# Patient Record
Sex: Female | Born: 1990
Health system: Southern US, Community
[De-identification: ages and names within clinical notes are randomized; demographics above are authoritative.]

## PROBLEM LIST (undated history)

## (undated) ENCOUNTER — Inpatient Hospital Stay (HOSPITAL_COMMUNITY): Payer: Self-pay

## (undated) DIAGNOSIS — F909 Attention-deficit hyperactivity disorder, unspecified type: Secondary | ICD-10-CM

## (undated) DIAGNOSIS — O139 Gestational [pregnancy-induced] hypertension without significant proteinuria, unspecified trimester: Secondary | ICD-10-CM

## (undated) DIAGNOSIS — E119 Type 2 diabetes mellitus without complications: Secondary | ICD-10-CM

## (undated) DIAGNOSIS — O24419 Gestational diabetes mellitus in pregnancy, unspecified control: Secondary | ICD-10-CM

## (undated) DIAGNOSIS — E039 Hypothyroidism, unspecified: Secondary | ICD-10-CM

## (undated) DIAGNOSIS — Z87442 Personal history of urinary calculi: Secondary | ICD-10-CM

## (undated) HISTORY — DX: Gestational diabetes mellitus in pregnancy, unspecified control: O24.419

## (undated) HISTORY — DX: Attention-deficit hyperactivity disorder, unspecified type: F90.9

## (undated) HISTORY — DX: Hypothyroidism, unspecified: E03.9

## (undated) HISTORY — DX: Gestational (pregnancy-induced) hypertension without significant proteinuria, unspecified trimester: O13.9

## (undated) HISTORY — DX: Type 2 diabetes mellitus without complications: E11.9

## (undated) HISTORY — DX: Personal history of urinary calculi: Z87.442

## (undated) HISTORY — PX: LITHOTRIPSY: SUR834

## (undated) HISTORY — PX: TYMPANOSTOMY TUBE PLACEMENT: SHX32

---
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
## 2012-11-02 NOTE — L&D Delivery Note (Signed)
Operative Delivery Note At 12:52 AM a viable female was delivered via Vaginal, Vacuum Investment banker, operational).  Presentation: vertex; Position: Left,, Occiput,, Anterior; Station: +2.  Verbal consent: obtained from patient.  Risks and benefits discussed in detail.  Risks include, but are not limited to the risks of anesthesia, bleeding, infection, damage to maternal tissues, fetal cephalhematoma.  There is also the risk of inability to effect vaginal delivery of the head, or shoulder dystocia that cannot be resolved by established maneuvers, leading to the need for emergency cesarean section.  Vacuum assisted delivery offered due to 3 hours of pushing.  APGAR: 5, 7; weight pending.   Placenta status: Intact, Spontaneous.   Cord: 3 vessels with the following complications: None.   One minute shoulder dystocia resolved with McRoberts maneuver and suprapubic pressure, left shoulder rotated anterior.  Loose nuchal cord x 1 reduced.  Anesthesia: Epidural  Instruments: Mushroom cup Episiotomy: None Lacerations: 2nd degree;Vaginal;Sulcus;Perineal Suture Repair: 3.0 vicryl rapide Est. Blood Loss (mL): 500  Mom to postpartum.  Baby to Couplet care / Skin to Skin, stable.  Wandalee Klang D 10/29/2013, 1:23 AM

## 2013-04-17 LAB — OB RESULTS CONSOLE GC/CHLAMYDIA
Chlamydia: NEGATIVE
Gonorrhea: NEGATIVE

## 2013-04-17 LAB — OB RESULTS CONSOLE ANTIBODY SCREEN: Antibody Screen: NEGATIVE

## 2013-04-17 LAB — OB RESULTS CONSOLE ABO/RH: RH Type: POSITIVE

## 2013-04-17 LAB — OB RESULTS CONSOLE RPR: RPR: NONREACTIVE

## 2013-06-07 ENCOUNTER — Encounter: Payer: Medicaid Other | Attending: Obstetrics and Gynecology | Admitting: *Deleted

## 2013-06-07 VITALS — Ht 60.0 in | Wt 209.4 lb

## 2013-06-07 DIAGNOSIS — O99810 Abnormal glucose complicating pregnancy: Secondary | ICD-10-CM | POA: Insufficient documentation

## 2013-06-07 DIAGNOSIS — Z713 Dietary counseling and surveillance: Secondary | ICD-10-CM | POA: Insufficient documentation

## 2013-06-08 ENCOUNTER — Encounter: Payer: Self-pay | Admitting: *Deleted

## 2013-06-08 NOTE — Patient Instructions (Signed)
Goals:  Check glucose levels per MD as instructed  Follow Gestational Diabetes Diet as instructed  Call for follow-up as needed    

## 2013-06-08 NOTE — Progress Notes (Signed)
  Patient was seen on 06/07/13 for Gestational Diabetes self-management class at the [facility]. The following learning objectives were met by the patient during this course:   States the definition of Gestational Diabetes  States why dietary management is important in controlling blood glucose  Describes the effects each nutrient has on blood glucose levels  Demonstrates ability to create a balanced meal plan  Demonstrates carbohydrate counting   States when to check blood glucose levels  Demonstrates proper blood glucose monitoring techniques  States the effect of stress and exercise on blood glucose levels  States the importance of limiting caffeine and abstaining from alcohol and smoking  Blood glucose monitor given: Accu Chek Nano BG Monitoring Kit Lot # [id number] Exp: 08/01/14 Blood glucose reading: 97 mg/dl  Patient instructed to monitor glucose levels: FBS: 60 - <90 1 hour: <140 2 hour: <120  *Patient received handouts:  Nutrition Diabetes and Pregnancy  Carbohydrate Counting List  Patient will be seen for follow-up as needed.

## 2013-08-18 ENCOUNTER — Other Ambulatory Visit (HOSPITAL_COMMUNITY): Payer: Self-pay | Admitting: Obstetrics and Gynecology

## 2013-08-18 DIAGNOSIS — IMO0002 Reserved for concepts with insufficient information to code with codable children: Secondary | ICD-10-CM

## 2013-08-18 DIAGNOSIS — Z0489 Encounter for examination and observation for other specified reasons: Secondary | ICD-10-CM

## 2013-08-24 ENCOUNTER — Other Ambulatory Visit (HOSPITAL_COMMUNITY): Payer: Self-pay | Admitting: Obstetrics and Gynecology

## 2013-08-24 ENCOUNTER — Ambulatory Visit (HOSPITAL_COMMUNITY)
Admission: RE | Admit: 2013-08-24 | Discharge: 2013-08-24 | Disposition: A | Discharge status: Home / Self Care | Payer: Medicaid Other | Source: Ambulatory Visit | Attending: Obstetrics and Gynecology | Admitting: Obstetrics and Gynecology

## 2013-08-24 DIAGNOSIS — Z363 Encounter for antenatal screening for malformations: Secondary | ICD-10-CM

## 2013-08-24 DIAGNOSIS — Z0489 Encounter for examination and observation for other specified reasons: Secondary | ICD-10-CM

## 2013-08-24 DIAGNOSIS — IMO0002 Reserved for concepts with insufficient information to code with codable children: Secondary | ICD-10-CM

## 2013-08-24 DIAGNOSIS — E669 Obesity, unspecified: Secondary | ICD-10-CM | POA: Insufficient documentation

## 2013-08-24 DIAGNOSIS — Z3689 Encounter for other specified antenatal screening: Secondary | ICD-10-CM | POA: Insufficient documentation

## 2013-09-22 ENCOUNTER — Ambulatory Visit (HOSPITAL_COMMUNITY): Payer: Self-pay

## 2013-10-12 ENCOUNTER — Inpatient Hospital Stay (HOSPITAL_COMMUNITY)
Admission: AD | Admit: 2013-10-12 | Discharge: 2013-10-12 | Disposition: A | Discharge status: Home / Self Care | Payer: BC Managed Care – PPO | Source: Ambulatory Visit | Attending: Obstetrics and Gynecology | Admitting: Obstetrics and Gynecology

## 2013-10-12 ENCOUNTER — Encounter (HOSPITAL_COMMUNITY): Payer: Self-pay

## 2013-10-12 DIAGNOSIS — O212 Late vomiting of pregnancy: Secondary | ICD-10-CM | POA: Insufficient documentation

## 2013-10-12 DIAGNOSIS — O4700 False labor before 37 completed weeks of gestation, unspecified trimester: Secondary | ICD-10-CM | POA: Diagnosis present

## 2013-10-12 DIAGNOSIS — A084 Viral intestinal infection, unspecified: Secondary | ICD-10-CM

## 2013-10-12 DIAGNOSIS — A088 Other specified intestinal infections: Secondary | ICD-10-CM

## 2013-10-12 DIAGNOSIS — R197 Diarrhea, unspecified: Secondary | ICD-10-CM | POA: Insufficient documentation

## 2013-10-12 DIAGNOSIS — O24419 Gestational diabetes mellitus in pregnancy, unspecified control: Secondary | ICD-10-CM

## 2013-10-12 DIAGNOSIS — O10019 Pre-existing essential hypertension complicating pregnancy, unspecified trimester: Secondary | ICD-10-CM | POA: Diagnosis not present

## 2013-10-12 DIAGNOSIS — O479 False labor, unspecified: Secondary | ICD-10-CM

## 2013-10-12 LAB — URINALYSIS, ROUTINE W REFLEX MICROSCOPIC
Glucose, UA: NEGATIVE mg/dL
Ketones, ur: NEGATIVE mg/dL
Leukocytes, UA: NEGATIVE
Nitrite: NEGATIVE
Protein, ur: NEGATIVE mg/dL

## 2013-10-12 LAB — URINE MICROSCOPIC-ADD ON

## 2013-10-12 NOTE — MAU Provider Note (Signed)
History     CSN: [id number]  Arrival date and time: 10/12/13 2149   First Provider Initiated Contact with Patient 10/12/13 2256      Chief Complaint  Patient presents with  . Labor Eval   HPI This is a 22 y.o. female at [redacted]w[redacted]d who presents for labor evaluation. ALso notes she had N/V/D yesterday. Nausea and vomiting went away and only had diarrhea today. No bleeding. + FM Seen by Dr Jackelyn Knife today, who noted elevated BP but did not do any labs   RN Note:  PT SAYS SHE WAS IN OFFICE TODAY- VE 1 CM. LAST SEX- 2 WEEKS AGO. DENIES HSV AND MRSA. SAYS VOMITED X1 THIS AM- X 2 DAYS       Patient is in with with c/o ctx q 5-6 mins since 2000pm. She denies vaginal bleeding or LOF. She reports good fetal movement. She states that she had n/v/d yesterday, today only diarrhea. She states that she was 1cm/70 today. Denies headache now or blurred vision. Patient states that her bp was elevated for the first time today.        OB History   Grav Para Term Preterm Abortions TAB SAB Ect Mult Living   2    1  1          Past Medical History  Diagnosis Date  . Diabetes mellitus without complication     GDM    History reviewed. No pertinent past surgical history.  Family History  Problem Relation Age of Onset  . Asthma Other   . Cancer Other   . COPD Other   . Hyperlipidemia Other   . Hypertension Other   . Heart disease Other   . Diabetes Other     History  Substance Use Topics  . Smoking status: Former Games developer  . Smokeless tobacco: Not on file  . Alcohol Use: No    Allergies: No Known Allergies  Prescriptions prior to admission  Medication Sig Dispense Refill  . Multiple Vitamin (MULTIVITAMIN) tablet Take 1 tablet by mouth daily.      . drospirenone-ethinyl estradiol (YAZ) 3-0.02 MG per tablet Take 1 tablet by mouth daily.        . metFORMIN (GLUCOPHAGE) 500 MG tablet Take 500 mg by mouth 2 (two) times daily with a meal.          Review of Systems  Constitutional:  Negative for fever, chills and malaise/fatigue.  Eyes: Negative for blurred vision and double vision.  Gastrointestinal: Positive for nausea, vomiting and diarrhea. Negative for abdominal pain and constipation.  Genitourinary: Negative for dysuria.  Neurological: Negative for dizziness, focal weakness and headaches.   Physical Exam   Blood pressure 130/79, pulse 87, temperature 97.8 F (36.6 C), temperature source Oral, resp. rate 18, SpO2 99.00%.  Filed Vitals:   10/12/13 2206 10/12/13 2227 10/12/13 2245 10/12/13 2259  BP: 140/86 124/63 123/80 130/79  Pulse: 102 101 99 87  Temp:      TempSrc:      Resp:      SpO2:        Physical Exam  Constitutional: She is oriented to person, place, and time. She appears well-developed and well-nourished. No distress.  HENT:  Head: Normocephalic.  Cardiovascular: Normal rate.   Respiratory: Effort normal.  GI: Soft. She exhibits no distension. There is no tenderness. There is no rebound.  Genitourinary: Vagina normal and uterus normal. No vaginal discharge found.  Dilation: 1 Effacement (%): 80 Station: -3 Presentation: Vertex Exam by::  Deanza Upperman, CNM   Musculoskeletal: Normal range of motion. She exhibits no edema.  Neurological: She is alert and oriented to person, place, and time. She has normal reflexes.  Skin: Skin is warm and dry.  Psychiatric: She has a normal mood and affect.  Fetal heart rate reactive UCs irregular 3-7 minutes  MAU Course  Procedures  MDM Results for orders placed during the hospital encounter of 10/12/13 (from the past 24 hour(s))  URINALYSIS, ROUTINE W REFLEX MICROSCOPIC     Status: Abnormal   Collection Time    10/12/13 10:00 PM      Result Value Range   Color, Urine YELLOW  YELLOW   APPearance CLEAR  CLEAR   Specific Gravity, Urine 1.025  1.005 - 1.030   pH 7.0  5.0 - 8.0   Glucose, UA NEGATIVE  NEGATIVE mg/dL   Hgb urine dipstick TRACE (*) NEGATIVE   Bilirubin Urine NEGATIVE  NEGATIVE   Ketones,  ur NEGATIVE  NEGATIVE mg/dL   Protein, ur NEGATIVE  NEGATIVE mg/dL   Urobilinogen, UA 0.2  0.0 - 1.0 mg/dL   Nitrite NEGATIVE  NEGATIVE   Leukocytes, UA NEGATIVE  NEGATIVE  URINE MICROSCOPIC-ADD ON     Status: Abnormal   Collection Time    10/12/13 10:00 PM      Result Value Range   Squamous Epithelial / LPF FEW (*) RARE   WBC, UA 0-2  <3 WBC/hpf   RBC / HPF 0-2  <3 RBC/hpf   Bacteria, UA FEW (*) RARE   Urine-Other MUCOUS PRESENT       Assessment and Plan  A:  SIUP at [redacted]w[redacted]d       Labile hypertenstion, with no proteinuria      Not in labor      Probable resolving gastroenteritis  P:  Discussed with Dr Ambrose Mantle      Discharge home       Push fluids       PIH precautions       Follow up in office as scheduled        [facility] 10/12/2013, 11:09 PM

## 2013-10-12 NOTE — MAU Note (Signed)
Patient is in with with c/o ctx q 5-6 mins since 2000pm. She denies vaginal bleeding or LOF. She reports good fetal movement. She states that she had n/v/d yesterday, today only diarrhea. She states that she was 1cm/70 today. Denies headache now or blurred vision. Patient states that her bp was elevated for the first time today.

## 2013-10-12 NOTE — MAU Note (Signed)
PT SAYS SHE WAS IN OFFICE TODAY-  VE 1 CM.  LAST SEX-  2 WEEKS AGO.   DENIES HSV AND MRSA.    SAYS VOMITED X1  THIS AM- X 2 DAYS

## 2013-10-24 ENCOUNTER — Encounter (HOSPITAL_COMMUNITY): Payer: Self-pay | Admitting: *Deleted

## 2013-10-24 ENCOUNTER — Telehealth (HOSPITAL_COMMUNITY): Payer: Self-pay | Admitting: *Deleted

## 2013-10-24 NOTE — Telephone Encounter (Signed)
Preadmission screen  

## 2013-10-28 ENCOUNTER — Inpatient Hospital Stay (HOSPITAL_COMMUNITY): Payer: Medicaid Other | Admitting: Anesthesiology

## 2013-10-28 ENCOUNTER — Encounter (HOSPITAL_COMMUNITY): Payer: Medicaid Other | Admitting: Anesthesiology

## 2013-10-28 ENCOUNTER — Inpatient Hospital Stay (HOSPITAL_COMMUNITY)
Admission: RE | Admit: 2013-10-28 | Discharge: 2013-10-30 | DRG: 775 | Disposition: A | Discharge status: Home / Self Care | Payer: Medicaid Other | Source: Ambulatory Visit | Attending: Obstetrics and Gynecology | Admitting: Obstetrics and Gynecology

## 2013-10-28 VITALS — BP 138/76 | HR 77 | Temp 98.3°F | Resp 18 | Ht 63.0 in | Wt 234.0 lb

## 2013-10-28 DIAGNOSIS — Z87891 Personal history of nicotine dependence: Secondary | ICD-10-CM

## 2013-10-28 DIAGNOSIS — O24410 Gestational diabetes mellitus in pregnancy, diet controlled: Secondary | ICD-10-CM | POA: Diagnosis present

## 2013-10-28 DIAGNOSIS — O99814 Abnormal glucose complicating childbirth: Principal | ICD-10-CM | POA: Diagnosis present

## 2013-10-28 DIAGNOSIS — Z3490 Encounter for supervision of normal pregnancy, unspecified, unspecified trimester: Secondary | ICD-10-CM

## 2013-10-28 DIAGNOSIS — O24419 Gestational diabetes mellitus in pregnancy, unspecified control: Secondary | ICD-10-CM

## 2013-10-28 LAB — CBC
HCT: 32.3 % — ABNORMAL LOW (ref 36.0–46.0)
MCHC: 34.1 g/dL (ref 30.0–36.0)
MCV: 79.4 fL (ref 78.0–100.0)
RDW: 14 % (ref 11.5–15.5)

## 2013-10-28 LAB — TYPE AND SCREEN
ABO/RH(D): O POS
Antibody Screen: NEGATIVE

## 2013-10-28 LAB — ABO/RH: ABO/RH(D): O POS

## 2013-10-28 LAB — RPR: RPR Ser Ql: NONREACTIVE

## 2013-10-28 LAB — GLUCOSE, CAPILLARY: Glucose-Capillary: 78 mg/dL (ref 70–99)

## 2013-10-28 MED ORDER — LIDOCAINE HCL (PF) 1 % IJ SOLN
INTRAMUSCULAR | Status: DC | PRN
  Administered 2013-10-28 (×2): 5 mL

## 2013-10-28 MED ORDER — OXYTOCIN 40 UNITS IN LACTATED RINGERS INFUSION - SIMPLE MED: 62.5000 mL/h | INTRAVENOUS | Status: DC

## 2013-10-28 MED ORDER — FENTANYL 2.5 MCG/ML BUPIVACAINE 1/10 % EPIDURAL INFUSION (WH - ANES)
14.0000 mL/h | INTRAMUSCULAR | Status: DC | PRN
  Administered 2013-10-28 (×2): 14 mL/h via EPIDURAL
  Filled 2013-10-28 (×2): qty 125

## 2013-10-28 MED ORDER — LACTATED RINGERS IV SOLN
500.0000 mL | INTRAVENOUS | Status: DC | PRN
  Administered 2013-10-28: 500 mL via INTRAVENOUS

## 2013-10-28 MED ORDER — ACETAMINOPHEN 325 MG PO TABS: 650.0000 mg | ORAL_TABLET | ORAL | Status: DC | PRN

## 2013-10-28 MED ORDER — LIDOCAINE HCL (PF) 1 % IJ SOLN
30.0000 mL | INTRAMUSCULAR | Status: DC | PRN
  Filled 2013-10-28 (×2): qty 30

## 2013-10-28 MED ORDER — EPHEDRINE 5 MG/ML INJ
10.0000 mg | INTRAVENOUS | Status: DC | PRN
  Filled 2013-10-28: qty 4
  Filled 2013-10-28: qty 2

## 2013-10-28 MED ORDER — OXYTOCIN BOLUS FROM INFUSION
500.0000 mL | INTRAVENOUS | Status: DC
  Administered 2013-10-29: 500 mL via INTRAVENOUS

## 2013-10-28 MED ORDER — EPHEDRINE 5 MG/ML INJ
10.0000 mg | INTRAVENOUS | Status: DC | PRN
  Filled 2013-10-28: qty 2

## 2013-10-28 MED ORDER — TERBUTALINE SULFATE 1 MG/ML IJ SOLN: 0.2500 mg | Freq: Once | INTRAMUSCULAR | Status: AC | PRN

## 2013-10-28 MED ORDER — DIPHENHYDRAMINE HCL 50 MG/ML IJ SOLN: 12.5000 mg | INTRAMUSCULAR | Status: DC | PRN

## 2013-10-28 MED ORDER — ONDANSETRON HCL 4 MG/2ML IJ SOLN: 4.0000 mg | Freq: Four times a day (QID) | INTRAMUSCULAR | Status: DC | PRN

## 2013-10-28 MED ORDER — LACTATED RINGERS IV SOLN
INTRAVENOUS | Status: DC
  Administered 2013-10-28 (×2): 125 mL/h via INTRAVENOUS
  Administered 2013-10-29: 01:00:00 via INTRAVENOUS

## 2013-10-28 MED ORDER — OXYCODONE-ACETAMINOPHEN 5-325 MG PO TABS: 1.0000 | ORAL_TABLET | ORAL | Status: DC | PRN

## 2013-10-28 MED ORDER — IBUPROFEN 600 MG PO TABS
600.0000 mg | ORAL_TABLET | Freq: Four times a day (QID) | ORAL | Status: DC | PRN
  Filled 2013-10-28: qty 1

## 2013-10-28 MED ORDER — PHENYLEPHRINE 40 MCG/ML (10ML) SYRINGE FOR IV PUSH (FOR BLOOD PRESSURE SUPPORT)
80.0000 ug | PREFILLED_SYRINGE | INTRAVENOUS | Status: DC | PRN
  Filled 2013-10-28: qty 2

## 2013-10-28 MED ORDER — PHENYLEPHRINE 40 MCG/ML (10ML) SYRINGE FOR IV PUSH (FOR BLOOD PRESSURE SUPPORT)
80.0000 ug | PREFILLED_SYRINGE | INTRAVENOUS | Status: DC | PRN
  Filled 2013-10-28: qty 2
  Filled 2013-10-28: qty 10

## 2013-10-28 MED ORDER — CITRIC ACID-SODIUM CITRATE 334-500 MG/5ML PO SOLN: 30.0000 mL | ORAL | Status: DC | PRN

## 2013-10-28 MED ORDER — OXYTOCIN 40 UNITS IN LACTATED RINGERS INFUSION - SIMPLE MED
1.0000 m[IU]/min | INTRAVENOUS | Status: DC
  Administered 2013-10-28: 2 m[IU]/min via INTRAVENOUS
  Filled 2013-10-28: qty 1000

## 2013-10-28 MED ORDER — LACTATED RINGERS IV SOLN: 500.0000 mL | Freq: Once | INTRAVENOUS | Status: DC

## 2013-10-28 NOTE — Anesthesia Procedure Notes (Signed)
Epidural Patient location during procedure: OB Start time: 10/28/2013 3:20 PM  Staffing Anesthesiologist: Brayton Caves Performed by: anesthesiologist   Preanesthetic Checklist Completed: patient identified, site marked, surgical consent, pre-op evaluation, timeout performed, IV checked, risks and benefits discussed and monitors and equipment checked  Epidural Patient position: sitting Prep: site prepped and draped and DuraPrep Patient monitoring: continuous pulse ox and blood pressure Approach: midline Injection technique: LOR air  Needle:  Needle type: Tuohy  Needle gauge: 17 G Needle length: 9 cm and 9 Needle insertion depth: 6 cm Catheter type: closed end flexible Catheter size: 19 Gauge Catheter at skin depth: 12 cm Test dose: negative  Assessment Events: blood not aspirated, injection not painful, no injection resistance, negative IV test and no paresthesia  Additional Notes Patient identified.  Risk benefits discussed including failed block, incomplete pain control, headache, nerve damage, paralysis, blood pressure changes, nausea, vomiting, reactions to medication both toxic or allergic, and postpartum back pain.  Patient expressed understanding and wished to proceed.  All questions were answered.  Sterile technique used throughout procedure and epidural site dressed with sterile barrier dressing. No paresthesia or other complications noted.The patient did not experience any signs of intravascular injection such as tinnitus or metallic taste in mouth nor signs of intrathecal spread such as rapid motor block. Please see nursing notes for vital signs.

## 2013-10-28 NOTE — Progress Notes (Signed)
Feeling some ctx, no really painful yet Afeb, VSS FHT- Cat I, ctx q 2-3 min VE-3/80/-1, vtx, AROM clear Continue pitocin, monitor progress.  CBGs were 70s so d/ced

## 2013-10-28 NOTE — Anesthesia Preprocedure Evaluation (Signed)
Anesthesia Evaluation  Patient identified by MRN, date of birth, ID band Patient awake    Reviewed: Allergy & Precautions, H&P , Patient's Chart, lab work & pertinent test results  Airway Mallampati: III TM Distance: >3 FB Neck ROM: full    Dental   Pulmonary asthma , former smoker,  breath sounds clear to auscultation        Cardiovascular Rhythm:regular Rate:Normal     Neuro/Psych PSYCHIATRIC DISORDERS    GI/Hepatic   Endo/Other  diabetesHypothyroidism Morbid obesity  Renal/GU      Musculoskeletal   Abdominal   Peds  Hematology   Anesthesia Other Findings   Reproductive/Obstetrics (+) Pregnancy                           Anesthesia Physical Anesthesia Plan  ASA: III  Anesthesia Plan: Epidural   Post-op Pain Management:    Induction:   Airway Management Planned:   Additional Equipment:   Intra-op Plan:   Post-operative Plan:   Informed Consent: I have reviewed the patients History and Physical, chart, labs and discussed the procedure including the risks, benefits and alternatives for the proposed anesthesia with the patient or authorized representative who has indicated his/her understanding and acceptance.     Plan Discussed with:   Anesthesia Plan Comments:         Anesthesia Quick Evaluation

## 2013-10-28 NOTE — Progress Notes (Signed)
Provided Dr. Jackelyn Knife with CBG results and order given to discontinue 2 hr CBG monitoring at this time.

## 2013-10-28 NOTE — H&P (Signed)
Holly Solis is a 22 y.o. female, G2 P0010, EGA [redacted] weeks with EDC 1-3 presenting for induction due to GDM and possible PIH.  Prenatal care complicated by GDM well controlled with diet alone, some slightly elevated BP in the office with normal labs.  See prenatal records for complete history.  Maternal Medical History:  Contractions: Frequency: irregular.   Perceived severity is mild.    Fetal activity: Perceived fetal activity is normal.    Prenatal Complications - Diabetes: gestational. Diabetes is managed by diet.      OB History   Grav Para Term Preterm Abortions TAB SAB Ect Mult Living   2    1  1         Past Medical History  Diagnosis Date  . Diabetes mellitus without complication     GDM  . Hypothyroidism     last check normal  . Asthma     as a child  . History of kidney stones   . ADHD (attention deficit hyperactivity disorder)   . Gestational diabetes    Past Surgical History  Procedure Laterality Date  . Lithotripsy     Family History: family history includes Asthma in her father and sister; COPD in her father; Cancer in her maternal grandmother; Diabetes in her father and mother; Heart disease in her maternal grandmother; Hyperlipidemia in her father and mother; Hypertension in her maternal grandmother; Kidney disease in her maternal grandmother. Social History:  reports that she quit smoking about 9 months ago. She has never used smokeless tobacco. She reports that she does not drink alcohol or use illicit drugs.   Prenatal Transfer Tool  Maternal Diabetes: Yes:  Diabetes Type:  Diet controlled Genetic Screening: Normal Maternal Ultrasounds/Referrals: Normal Fetal Ultrasounds or other Referrals:  None Maternal Substance Abuse:  No Significant Maternal Medications:  None Significant Maternal Lab Results:  Lab values include: Group B Strep negative Other Comments:  possible PIH  Review of Systems  Respiratory: Negative.   Cardiovascular: Negative.      Dilation: 2 Effacement (%): 70 Station: -1 Exam by:: Dr. Jackelyn Knife  Blood pressure 145/91, pulse 87, temperature 98.1 F (36.7 C). Maternal Exam:  Uterine Assessment: Contraction strength is mild.  Contraction frequency is irregular.   Abdomen: Patient reports no abdominal tenderness. Estimated fetal weight is 8 lbs.   Fetal presentation: vertex  Introitus: Normal vulva. Normal vagina.  Amniotic fluid character: not assessed.  Pelvis: adequate for delivery.   Cervix: Cervix evaluated by digital exam.     Fetal Exam Fetal Monitor Review: Mode: ultrasound.   Baseline rate: 140.  Variability: moderate (6-25 bpm).   Pattern: accelerations present and no decelerations.    Fetal State Assessment: Category I - tracings are normal.     Physical Exam  Constitutional: She appears well-developed and well-nourished.  Cardiovascular: Normal rate, regular rhythm and normal heart sounds.   No murmur heard. Respiratory: Effort normal and breath sounds normal. No respiratory distress. She has no wheezes.  GI: Soft.  Gravid     Prenatal labs: ABO, Rh: O/Positive/-- (06/16 0000) Antibody: Negative (06/16 0000) Rubella: Immune (06/16 0000) RPR: Nonreactive (06/16 0000)  HBsAg: Negative (06/16 0000)  HIV: Non-reactive (06/16 0000)  GBS: Negative (12/05 0000)   Assessment/Plan: IUP at 39 weeks with diet controlled GDM, possible PIH and favorable cervix, admitted for induction.  Will start pitocin, check CBG, monitor BP.  If BP elevated will check PIH labs.   Yeudiel Mateo D 10/28/2013, 8:38 AM

## 2013-10-29 ENCOUNTER — Encounter (HOSPITAL_COMMUNITY): Payer: Self-pay

## 2013-10-29 MED ORDER — ONDANSETRON HCL 4 MG PO TABS: 4.0000 mg | ORAL_TABLET | ORAL | Status: DC | PRN

## 2013-10-29 MED ORDER — METHYLERGONOVINE MALEATE 0.2 MG/ML IJ SOLN: 0.2000 mg | INTRAMUSCULAR | Status: DC | PRN

## 2013-10-29 MED ORDER — LANOLIN HYDROUS EX OINT: TOPICAL_OINTMENT | CUTANEOUS | Status: DC | PRN

## 2013-10-29 MED ORDER — MAGNESIUM HYDROXIDE 400 MG/5ML PO SUSP: 30.0000 mL | ORAL | Status: DC | PRN

## 2013-10-29 MED ORDER — DIPHENHYDRAMINE HCL 25 MG PO CAPS: 25.0000 mg | ORAL_CAPSULE | Freq: Four times a day (QID) | ORAL | Status: DC | PRN

## 2013-10-29 MED ORDER — TETANUS-DIPHTH-ACELL PERTUSSIS 5-2.5-18.5 LF-MCG/0.5 IM SUSP: 0.5000 mL | Freq: Once | INTRAMUSCULAR | Status: DC

## 2013-10-29 MED ORDER — PRENATAL MULTIVITAMIN CH
1.0000 | ORAL_TABLET | Freq: Every day | ORAL | Status: DC
  Administered 2013-10-29 – 2013-10-30 (×2): 1 via ORAL
  Filled 2013-10-29 (×2): qty 1

## 2013-10-29 MED ORDER — BENZOCAINE-MENTHOL 20-0.5 % EX AERO
1.0000 "application " | INHALATION_SPRAY | CUTANEOUS | Status: DC | PRN
  Administered 2013-10-29 (×2): 1 via TOPICAL
  Filled 2013-10-29 (×2): qty 56

## 2013-10-29 MED ORDER — SIMETHICONE 80 MG PO CHEW: 80.0000 mg | CHEWABLE_TABLET | ORAL | Status: DC | PRN

## 2013-10-29 MED ORDER — IBUPROFEN 600 MG PO TABS
600.0000 mg | ORAL_TABLET | Freq: Four times a day (QID) | ORAL | Status: DC
  Administered 2013-10-29 – 2013-10-30 (×6): 600 mg via ORAL
  Filled 2013-10-29 (×6): qty 1

## 2013-10-29 MED ORDER — SENNOSIDES-DOCUSATE SODIUM 8.6-50 MG PO TABS
2.0000 | ORAL_TABLET | ORAL | Status: DC
  Administered 2013-10-30: 2 via ORAL
  Filled 2013-10-29: qty 2

## 2013-10-29 MED ORDER — ZOLPIDEM TARTRATE 5 MG PO TABS: 5.0000 mg | ORAL_TABLET | Freq: Every evening | ORAL | Status: DC | PRN

## 2013-10-29 MED ORDER — METHYLERGONOVINE MALEATE 0.2 MG PO TABS: 0.2000 mg | ORAL_TABLET | ORAL | Status: DC | PRN

## 2013-10-29 MED ORDER — DIBUCAINE 1 % RE OINT: 1.0000 "application " | TOPICAL_OINTMENT | RECTAL | Status: DC | PRN

## 2013-10-29 MED ORDER — MEASLES, MUMPS & RUBELLA VAC ~~LOC~~ INJ
0.5000 mL | INJECTION | Freq: Once | SUBCUTANEOUS | Status: DC
  Filled 2013-10-29: qty 0.5

## 2013-10-29 MED ORDER — WITCH HAZEL-GLYCERIN EX PADS: 1.0000 "application " | MEDICATED_PAD | CUTANEOUS | Status: DC | PRN

## 2013-10-29 MED ORDER — ONDANSETRON HCL 4 MG/2ML IJ SOLN: 4.0000 mg | INTRAMUSCULAR | Status: DC | PRN

## 2013-10-29 MED ORDER — OXYCODONE-ACETAMINOPHEN 5-325 MG PO TABS
1.0000 | ORAL_TABLET | ORAL | Status: DC | PRN
  Administered 2013-10-29 – 2013-10-30 (×4): 1 via ORAL
  Filled 2013-10-29 (×4): qty 1

## 2013-10-29 NOTE — Progress Notes (Signed)
PPD #0 Doing ok Afeb, VSS, BP a little labile Continue routine care

## 2013-10-29 NOTE — Anesthesia Postprocedure Evaluation (Signed)
Anesthesia Post Note  Patient: Holly Solis  Procedure(s) Performed: * No procedures listed *  Anesthesia type: Epidural  Patient location: Mother/Baby  Post pain: Pain level controlled  Post assessment: Post-op Vital signs reviewed  Last Vitals:  Filed Vitals:   10/29/13 0836  BP: 128/79  Pulse: 111  Temp: 36.5 C  Resp: 18    Post vital signs: Reviewed  Level of consciousness:alert  Complications: No apparent anesthesia complications

## 2013-10-30 MED ORDER — IBUPROFEN 600 MG PO TABS: 600.0000 mg | ORAL_TABLET | Freq: Four times a day (QID) | ORAL | Status: DC

## 2013-10-30 MED ORDER — OXYCODONE-ACETAMINOPHEN 5-325 MG PO TABS: 1.0000 | ORAL_TABLET | ORAL | Status: DC | PRN

## 2013-10-30 NOTE — Progress Notes (Signed)
PPD #1 No problems, wants to go home Afeb, VSS Fundus firm, NT at U-0 Continue routine postpartum care, d/c home if baby can go

## 2013-10-30 NOTE — Progress Notes (Signed)
UR chart review completed.  

## 2013-10-30 NOTE — Discharge Summary (Signed)
Obstetric Discharge Summary Reason for Admission: induction of labor Prenatal Procedures: none Intrapartum Procedures: vacuum Postpartum Procedures: none Complications-Operative and Postpartum: 2nd degree perineal laceration and right vaginal sulcus lac Hemoglobin  Date Value Range Status  10/28/2013 11.0* 12.0 - 15.0 g/dL Final     HCT  Date Value Range Status  10/28/2013 32.3* 36.0 - 46.0 % Final    Physical Exam:  General: alert Lochia: appropriate Uterine Fundus: firm  Discharge Diagnoses: Term Pregnancy-delivered and GDM, mild shoulder dystocia  Discharge Information: Date: 10/30/2013 Activity: pelvic rest Diet: routine Medications: Ibuprofen and Percocet Condition: stable Instructions: refer to practice specific booklet Discharge to: home Follow-up Information   Follow up with Andrena Margerum D, MD. Schedule an appointment as soon as possible for a visit in 6 weeks.   Specialty:  Obstetrics and Gynecology   Contact information:   [address], [address] [place] [address] [phone]       Newborn Data: Live born female  Birth Weight: 9 lb 1.2 oz (4116 g) APGAR: 5, 7  Home with mother.  Holly Solis 10/30/2013, 8:14 AM

## 2014-09-03 ENCOUNTER — Encounter (HOSPITAL_COMMUNITY): Payer: Self-pay

## 2016-01-23 ENCOUNTER — Emergency Department (HOSPITAL_COMMUNITY)
Admission: EM | Admit: 2016-01-23 | Discharge: 2016-01-24 | Disposition: A | Discharge status: Home / Self Care | Payer: 59 | Attending: Emergency Medicine | Admitting: Emergency Medicine

## 2016-01-23 ENCOUNTER — Encounter (HOSPITAL_COMMUNITY): Payer: Self-pay | Admitting: *Deleted

## 2016-01-23 DIAGNOSIS — Z8659 Personal history of other mental and behavioral disorders: Secondary | ICD-10-CM | POA: Diagnosis not present

## 2016-01-23 DIAGNOSIS — Z87891 Personal history of nicotine dependence: Secondary | ICD-10-CM | POA: Insufficient documentation

## 2016-01-23 DIAGNOSIS — N3090 Cystitis, unspecified without hematuria: Secondary | ICD-10-CM | POA: Insufficient documentation

## 2016-01-23 DIAGNOSIS — Z8639 Personal history of other endocrine, nutritional and metabolic disease: Secondary | ICD-10-CM | POA: Diagnosis not present

## 2016-01-23 DIAGNOSIS — Z8632 Personal history of gestational diabetes: Secondary | ICD-10-CM | POA: Diagnosis not present

## 2016-01-23 DIAGNOSIS — J45909 Unspecified asthma, uncomplicated: Secondary | ICD-10-CM | POA: Insufficient documentation

## 2016-01-23 DIAGNOSIS — R1031 Right lower quadrant pain: Secondary | ICD-10-CM | POA: Diagnosis not present

## 2016-01-23 DIAGNOSIS — N3091 Cystitis, unspecified with hematuria: Secondary | ICD-10-CM

## 2016-01-23 DIAGNOSIS — Z87442 Personal history of urinary calculi: Secondary | ICD-10-CM | POA: Diagnosis not present

## 2016-01-23 DIAGNOSIS — R109 Unspecified abdominal pain: Secondary | ICD-10-CM

## 2016-01-23 DIAGNOSIS — Z79899 Other long term (current) drug therapy: Secondary | ICD-10-CM | POA: Diagnosis not present

## 2016-01-23 LAB — COMPREHENSIVE METABOLIC PANEL
ALK PHOS: 62 U/L (ref 38–126)
ALT: 18 U/L (ref 14–54)
ANION GAP: 9 (ref 5–15)
AST: 21 U/L (ref 15–41)
Albumin: 4.3 g/dL (ref 3.5–5.0)
BILIRUBIN TOTAL: 0.6 mg/dL (ref 0.3–1.2)
BUN: 11 mg/dL (ref 6–20)
CO2: 26 mmol/L (ref 22–32)
CREATININE: 0.8 mg/dL (ref 0.44–1.00)
Calcium: 9.3 mg/dL (ref 8.9–10.3)
Chloride: 104 mmol/L (ref 101–111)
GFR calc non Af Amer: >60 mL/min (ref >60)
Glucose, Bld: 114 mg/dL — ABNORMAL HIGH (ref 65–99)
Potassium: 4 mmol/L (ref 3.5–5.1)
Sodium: 139 mmol/L (ref 135–145)
Total Protein: 7.2 g/dL (ref 6.5–8.1)

## 2016-01-23 LAB — CBC
HEMATOCRIT: 41.8 % (ref 36.0–46.0)
Hemoglobin: 13.9 g/dL (ref 12.0–15.0)
MCH: 27.5 pg (ref 26.0–34.0)
MCHC: 33.3 g/dL (ref 30.0–36.0)
MCV: 82.8 fL (ref 78.0–100.0)
PLATELETS: 266 10*3/uL (ref 150–400)
RBC: 5.05 MIL/uL (ref 3.87–5.11)
RDW: 12.8 % (ref 11.5–15.5)
WBC: 12.6 10*3/uL — ABNORMAL HIGH (ref 4.0–10.5)

## 2016-01-23 LAB — URINE MICROSCOPIC-ADD ON

## 2016-01-23 LAB — URINALYSIS, ROUTINE W REFLEX MICROSCOPIC
GLUCOSE, UA: NEGATIVE mg/dL
KETONES UR: NEGATIVE mg/dL
Nitrite: NEGATIVE
PH: 6 (ref 5.0–8.0)
PROTEIN: 30 mg/dL — AB
Specific Gravity, Urine: 1.024 (ref 1.005–1.030)

## 2016-01-23 LAB — LIPASE, BLOOD: Lipase: 22 U/L (ref 11–51)

## 2016-01-23 MED ORDER — OXYCODONE-ACETAMINOPHEN 5-325 MG PO TABS
ORAL_TABLET | ORAL | Status: AC
  Filled 2016-01-23: qty 1

## 2016-01-23 MED ORDER — ONDANSETRON 4 MG PO TBDP
ORAL_TABLET | ORAL | Status: DC
Start: 2016-01-23 — End: 2016-01-24
  Filled 2016-01-23: qty 1

## 2016-01-23 MED ORDER — ONDANSETRON 4 MG PO TBDP
4.0000 mg | ORAL_TABLET | Freq: Once | ORAL | Status: AC | PRN
  Administered 2016-01-23: 4 mg via ORAL

## 2016-01-23 MED ORDER — OXYCODONE-ACETAMINOPHEN 5-325 MG PO TABS
1.0000 | ORAL_TABLET | Freq: Once | ORAL | Status: AC
  Administered 2016-01-23: 1 via ORAL

## 2016-01-23 NOTE — ED Notes (Signed)
Pt reports lower abdominal pain ans well as flank pain that radiates to abdomen. Pt reports difficulty urinating at times as well. Pt reports that this has been ongoing for six days. Pt now reports N/v as well.

## 2016-01-24 ENCOUNTER — Emergency Department (HOSPITAL_COMMUNITY): Payer: 59

## 2016-01-24 DIAGNOSIS — R109 Unspecified abdominal pain: Secondary | ICD-10-CM | POA: Diagnosis not present

## 2016-01-24 DIAGNOSIS — R102 Pelvic and perineal pain: Secondary | ICD-10-CM | POA: Diagnosis not present

## 2016-01-24 DIAGNOSIS — Z87442 Personal history of urinary calculi: Secondary | ICD-10-CM | POA: Diagnosis not present

## 2016-01-24 DIAGNOSIS — T8339XA Other mechanical complication of intrauterine contraceptive device, initial encounter: Secondary | ICD-10-CM | POA: Diagnosis not present

## 2016-01-24 DIAGNOSIS — Z8659 Personal history of other mental and behavioral disorders: Secondary | ICD-10-CM | POA: Diagnosis not present

## 2016-01-24 DIAGNOSIS — Z87891 Personal history of nicotine dependence: Secondary | ICD-10-CM | POA: Diagnosis not present

## 2016-01-24 DIAGNOSIS — Z8639 Personal history of other endocrine, nutritional and metabolic disease: Secondary | ICD-10-CM | POA: Diagnosis not present

## 2016-01-24 DIAGNOSIS — Z8632 Personal history of gestational diabetes: Secondary | ICD-10-CM | POA: Diagnosis not present

## 2016-01-24 DIAGNOSIS — N390 Urinary tract infection, site not specified: Secondary | ICD-10-CM | POA: Diagnosis not present

## 2016-01-24 DIAGNOSIS — J45909 Unspecified asthma, uncomplicated: Secondary | ICD-10-CM | POA: Diagnosis not present

## 2016-01-24 DIAGNOSIS — R319 Hematuria, unspecified: Secondary | ICD-10-CM | POA: Diagnosis not present

## 2016-01-24 DIAGNOSIS — Z79899 Other long term (current) drug therapy: Secondary | ICD-10-CM | POA: Diagnosis not present

## 2016-01-24 DIAGNOSIS — N3090 Cystitis, unspecified without hematuria: Secondary | ICD-10-CM | POA: Diagnosis not present

## 2016-01-24 MED ORDER — SULFAMETHOXAZOLE-TRIMETHOPRIM 800-160 MG PO TABS: 1.0000 | ORAL_TABLET | Freq: Two times a day (BID) | ORAL | Status: DC

## 2016-01-24 MED ORDER — SULFAMETHOXAZOLE-TRIMETHOPRIM 800-160 MG PO TABS
1.0000 | ORAL_TABLET | Freq: Once | ORAL | Status: AC
  Administered 2016-01-24: 1 via ORAL
  Filled 2016-01-24: qty 1

## 2016-01-24 MED ORDER — ONDANSETRON 4 MG PO TBDP: 4.0000 mg | ORAL_TABLET | Freq: Three times a day (TID) | ORAL | Status: DC | PRN

## 2016-01-24 MED ORDER — HYDROCODONE-ACETAMINOPHEN 5-325 MG PO TABS
1.0000 | ORAL_TABLET | Freq: Once | ORAL | Status: AC
  Administered 2016-01-24: 1 via ORAL
  Filled 2016-01-24: qty 1

## 2016-01-24 MED ORDER — HYDROCODONE-ACETAMINOPHEN 5-325 MG PO TABS: 1.0000 | ORAL_TABLET | Freq: Four times a day (QID) | ORAL | Status: DC | PRN

## 2016-01-24 NOTE — Discharge Instructions (Signed)
CT scan shows that you have normal appendix, normal uterus and ovaries your lab tests show that you have a urinary tract infection.  U been started on Septra as an antibiotic and given a prescription for Zofran and Vicodin for pain and nausea control.  Please make a point with your primary care physician for follow-up

## 2016-01-24 NOTE — ED Notes (Signed)
Patient transported to CT 

## 2016-01-24 NOTE — ED Provider Notes (Signed)
CSN: [id number]     Arrival date & time 01/23/16  1722 History   First MD Initiated Contact with Patient 01/23/16 2348     Chief Complaint  Patient presents with  . Abdominal Pain     (Consider location/radiation/quality/duration/timing/severity/associated sxs/prior Treatment) HPI Comments: For the past 6 days has had R side pain radiating to suprapubic region Called PCP today for appointment ans sent to ED to rule out appy. Has Hx kidney stone as a child, denies vaginal dischrge or concerns for STD, diarrhea, constipation nausea, vomiting change in appetite.  Patient is a 25 y.o. female presenting with abdominal pain. The history is provided by the patient.  Abdominal Pain Pain location:  R flank and RLQ Pain quality: aching   Pain radiates to:  Suprapubic region Pain severity:  Moderate Onset quality:  Gradual Duration:  6 days Timing:  Constant Progression:  Unchanged Chronicity:  New Relieved by:  Nothing Worsened by:  Palpation Ineffective treatments:  None tried Associated symptoms: dysuria and hematuria   Associated symptoms: no chest pain, no chills, no cough, no fever, no shortness of breath, no vaginal bleeding and no vaginal discharge     Past Medical History  Diagnosis Date  . Diabetes mellitus without complication (HCC)     GDM  . Hypothyroidism     last check normal  . Asthma     as a child  . History of kidney stones   . ADHD (attention deficit hyperactivity disorder)   . Gestational diabetes    Past Surgical History  Procedure Laterality Date  . Lithotripsy     Family History  Problem Relation Age of Onset  . Diabetes Mother   . Hyperlipidemia Mother   . Asthma Father   . COPD Father   . Diabetes Father   . Hyperlipidemia Father   . Asthma Sister   . Cancer Maternal Grandmother   . Heart disease Maternal Grandmother   . Hypertension Maternal Grandmother   . Kidney disease Maternal Grandmother    Social History  Substance Use Topics  .  Smoking status: Former Smoker    Quit date: 01/22/2013  . Smokeless tobacco: Never Used  . Alcohol Use: No   OB History    Gravida Para Term Preterm AB TAB SAB Ectopic Multiple Living   Review of Systems  Constitutional: Negative for fever and chills.  Respiratory: Negative for cough and shortness of breath.   Cardiovascular: Negative for chest pain.  Gastrointestinal: Positive for abdominal pain.  Genitourinary: Positive for dysuria and hematuria. Negative for decreased urine volume, vaginal bleeding, vaginal discharge, vaginal pain and menstrual problem.  All other systems reviewed and are negative.     Allergies  Review of patient's allergies indicates no known allergies.  Home Medications   Prior to Admission medications   Medication Sig Start Date End Date Taking? Authorizing Provider  HYDROcodone-acetaminophen (NORCO/VICODIN) 5-325 MG tablet Take 1 tablet by mouth every 6 (six) hours as needed for moderate pain or severe pain. 01/24/16   Earley Favor, NP  ibuprofen (ADVIL,MOTRIN) 600 MG tablet Take 1 tablet (600 mg total) by mouth every 6 (six) hours. 10/30/13   Lavina Hamman, MD  ondansetron (ZOFRAN-ODT) 4 MG disintegrating tablet Take 1 tablet (4 mg total) by mouth every 8 (eight) hours as needed for nausea or vomiting. 01/24/16   Earley Favor, NP  oxyCODONE-acetaminophen (PERCOCET/ROXICET) 5-325 MG per tablet Take 1-2 tablets by  mouth every 4 (four) hours as needed for severe pain (moderate - severe pain). 10/30/13   Lavina Hamman, MD  Prenatal Vit-Fe Fumarate-FA (PRENATAL MULTIVITAMIN) TABS tablet Take 1 tablet by mouth daily at 12 noon.    Historical Provider, MD  sulfamethoxazole-trimethoprim (BACTRIM DS,SEPTRA DS) 800-160 MG tablet Take 1 tablet by mouth 2 (two) times daily. 01/24/16   Earley Favor, NP   BP 132/94 mmHg  Pulse 88  Temp(Src) 97.8 F (36.6 C) (Oral)  Resp 20  SpO2 99%  LMP 01/20/2016 Physical Exam  Constitutional: She appears  well-developed and well-nourished.  HENT:  Head: Normocephalic.  Eyes: Pupils are equal, round, and reactive to light.  Neck: Normal range of motion.  Cardiovascular: Normal rate and regular rhythm.   Pulmonary/Chest: Effort normal and breath sounds normal.  Abdominal: Soft. Bowel sounds are normal.  Musculoskeletal: Normal range of motion.  Neurological: She is alert.  Skin: Skin is warm and dry.  Nursing note and vitals reviewed.   ED Course  Procedures (including critical care time) Labs Review Labs Reviewed  COMPREHENSIVE METABOLIC PANEL - Abnormal; Notable for the following:    Glucose, Bld 114 (*)    All other components within normal limits  CBC - Abnormal; Notable for the following:    WBC 12.6 (*)    All other components within normal limits  URINALYSIS, ROUTINE W REFLEX MICROSCOPIC (NOT AT [facility]) - Abnormal; Notable for the following:    Color, Urine AMBER (*)    APPearance CLOUDY (*)    Hgb urine dipstick LARGE (*)    Bilirubin Urine SMALL (*)    Protein, ur 30 (*)    Leukocytes, UA SMALL (*)    All other components within normal limits  URINE MICROSCOPIC-ADD ON - Abnormal; Notable for the following:    Squamous Epithelial / LPF 6-30 (*)    Bacteria, UA MANY (*)    All other components within normal limits  LIPASE, BLOOD    Imaging Review Ct Renal Stone Study  01/24/2016  CLINICAL DATA:  Right flank pain and hematuria. History of kidney stones. EXAM: CT ABDOMEN AND PELVIS WITHOUT CONTRAST TECHNIQUE: Multidetector CT imaging of the abdomen and pelvis was performed following the standard protocol without IV contrast. COMPARISON:  12/05/2010 FINDINGS: Lung bases are clear. Kidneys are symmetrical in size and shape. No hydronephrosis or hydroureter view. No renal, ureteral, or bladder stones identified. No bladder wall thickening. The unenhanced appearance of the liver, spleen, gallbladder, pancreas, adrenal glands, abdominal aorta, inferior vena cava, and  retroperitoneal lymph nodes are unremarkable. Stomach, small bowel, and colon are not abnormally distended. No free air or free fluid in the abdomen. The Pelvis: Appendix is normal. Uterus and ovaries are not enlarged. There is an intrauterine device. Intrauterine device appears to be low when the endometrium. Adequate positioning is not confirmed. No free or loculated pelvic fluid collections. No pelvic mass or lymphadenopathy. No destructive bone lesions. IMPRESSION: No renal or ureteral stone or obstruction. Intrauterine device is positioned low and the lower uterine segment, likely out of position. Electronically Signed   By: Burman Nieves M.D.   On: 01/24/2016 02:15   I have personally reviewed and evaluated these images and lab results as part of my medical decision-making.   EKG Interpretation None    Renal stone study reviewed showing that she has normal appendix.  Normal ovaries and uterus labs show that she has a urinary tract infection.  She will be treated with Septra.  She is also  given prescriptions for Zofran and Vicodin for symptom control and instructed to follow-up with her primary care physician  MDM   Final diagnoses:  Right flank pain  Hemorrhagic cystitis         Earley FavorGail Tray Klayman, NP 01/24/16 0246  Raeford RazorStephen Kohut, MD 01/28/16 1040

## 2016-01-31 DIAGNOSIS — Z30430 Encounter for insertion of intrauterine contraceptive device: Secondary | ICD-10-CM | POA: Diagnosis not present

## 2016-04-01 DIAGNOSIS — Z30431 Encounter for routine checking of intrauterine contraceptive device: Secondary | ICD-10-CM | POA: Diagnosis not present

## 2016-04-01 DIAGNOSIS — N6452 Nipple discharge: Secondary | ICD-10-CM | POA: Diagnosis not present

## 2016-04-01 DIAGNOSIS — N6459 Other signs and symptoms in breast: Secondary | ICD-10-CM | POA: Diagnosis not present

## 2016-04-09 DIAGNOSIS — F900 Attention-deficit hyperactivity disorder, predominantly inattentive type: Secondary | ICD-10-CM | POA: Diagnosis not present

## 2016-04-09 MED FILL — AMPHETAMINE SALTS 5 MG TAB: 5 | 30 days supply | Qty: 30 | Fill #0

## 2016-04-09 MED FILL — DEXTROAMP-AMPHET ER 30 MG C: 30 | 30 days supply | Qty: 30 | Fill #0

## 2016-05-14 DIAGNOSIS — Z30432 Encounter for removal of intrauterine contraceptive device: Secondary | ICD-10-CM | POA: Diagnosis not present

## 2016-05-14 DIAGNOSIS — Z7689 Persons encountering health services in other specified circumstances: Secondary | ICD-10-CM | POA: Diagnosis not present

## 2016-05-14 DIAGNOSIS — N939 Abnormal uterine and vaginal bleeding, unspecified: Secondary | ICD-10-CM | POA: Diagnosis not present

## 2016-05-15 MED FILL — DEXTROAMP-AMP 10 MG TAB: 10 | 30 days supply | Qty: 30 | Fill #0

## 2016-05-15 MED FILL — PRENATAL VITAMIN PLUS LOW I: 27-1 | 30 days supply | Qty: 30 | Fill #0

## 2016-05-15 MED FILL — DEXTROAMP-AMPHET ER 30 MG C: 30 | 30 days supply | Qty: 30 | Fill #0

## 2016-08-05 DIAGNOSIS — N912 Amenorrhea, unspecified: Secondary | ICD-10-CM | POA: Diagnosis not present

## 2016-08-07 MED FILL — PRENATAL VITAMIN PLUS LOW I: 27-1 | 30 days supply | Qty: 30 | Fill #1

## 2016-09-01 DIAGNOSIS — Z3201 Encounter for pregnancy test, result positive: Secondary | ICD-10-CM | POA: Diagnosis not present

## 2016-09-01 DIAGNOSIS — N912 Amenorrhea, unspecified: Secondary | ICD-10-CM | POA: Diagnosis not present

## 2016-09-16 DIAGNOSIS — Z3481 Encounter for supervision of other normal pregnancy, first trimester: Secondary | ICD-10-CM | POA: Diagnosis not present

## 2016-09-16 DIAGNOSIS — O26891 Other specified pregnancy related conditions, first trimester: Secondary | ICD-10-CM | POA: Diagnosis not present

## 2016-09-16 DIAGNOSIS — Z3689 Encounter for other specified antenatal screening: Secondary | ICD-10-CM | POA: Diagnosis not present

## 2016-09-16 DIAGNOSIS — Z3A10 10 weeks gestation of pregnancy: Secondary | ICD-10-CM | POA: Diagnosis not present

## 2016-09-16 DIAGNOSIS — Z368A Encounter for antenatal screening for other genetic defects: Secondary | ICD-10-CM | POA: Diagnosis not present

## 2016-09-16 LAB — OB RESULTS CONSOLE ABO/RH: RH TYPE: POSITIVE

## 2016-09-16 LAB — OB RESULTS CONSOLE HIV ANTIBODY (ROUTINE TESTING): HIV: NONREACTIVE

## 2016-09-16 LAB — OB RESULTS CONSOLE RPR: RPR: NONREACTIVE

## 2016-09-16 LAB — OB RESULTS CONSOLE HEPATITIS B SURFACE ANTIGEN: HEP B S AG: NEGATIVE

## 2016-09-16 LAB — OB RESULTS CONSOLE GC/CHLAMYDIA
Chlamydia: NEGATIVE
Gonorrhea: NEGATIVE

## 2016-09-16 LAB — OB RESULTS CONSOLE RUBELLA ANTIBODY, IGM: Rubella: IMMUNE

## 2016-09-17 DIAGNOSIS — Z3689 Encounter for other specified antenatal screening: Secondary | ICD-10-CM | POA: Diagnosis not present

## 2016-09-23 MED FILL — PRENATAL VITAMIN PLUS LOW I: 27-1 | 90 days supply | Qty: 90 | Fill #2

## 2016-09-29 DIAGNOSIS — Z3A12 12 weeks gestation of pregnancy: Secondary | ICD-10-CM | POA: Diagnosis not present

## 2016-09-29 DIAGNOSIS — Z3682 Encounter for antenatal screening for nuchal translucency: Secondary | ICD-10-CM | POA: Diagnosis not present

## 2016-09-30 DIAGNOSIS — Z3682 Encounter for antenatal screening for nuchal translucency: Secondary | ICD-10-CM | POA: Diagnosis not present

## 2016-10-06 DIAGNOSIS — O09511 Supervision of elderly primigravida, first trimester: Secondary | ICD-10-CM | POA: Diagnosis not present

## 2016-10-19 DIAGNOSIS — Z3689 Encounter for other specified antenatal screening: Secondary | ICD-10-CM | POA: Diagnosis not present

## 2016-10-19 DIAGNOSIS — Z3A15 15 weeks gestation of pregnancy: Secondary | ICD-10-CM | POA: Diagnosis not present

## 2016-10-19 DIAGNOSIS — Z3482 Encounter for supervision of other normal pregnancy, second trimester: Secondary | ICD-10-CM | POA: Diagnosis not present

## 2016-10-30 DIAGNOSIS — O99810 Abnormal glucose complicating pregnancy: Secondary | ICD-10-CM | POA: Diagnosis not present

## 2016-11-02 NOTE — L&D Delivery Note (Signed)
Delivery Note At 5:53 PM a viable female was delivered via Vaginal, Spontaneous Delivery (Presentation: vtx; ROA ).  APGAR: 8, 9; weight pending.   Placenta status: spontaneous, intact.  Cord:  with the following complications: nuchal x 1 reduced.   Anesthesia:  Epidural Episiotomy: None Lacerations: Periurethral Suture Repair: none Est. Blood Loss (mL): 200  Mom to postpartum.  Baby to Couplet care / Skin to Skin.  Holly Solis 03/26/2017, 6:07 PM

## 2016-11-12 DIAGNOSIS — Z3A18 18 weeks gestation of pregnancy: Secondary | ICD-10-CM | POA: Diagnosis not present

## 2016-11-12 DIAGNOSIS — O289 Unspecified abnormal findings on antenatal screening of mother: Secondary | ICD-10-CM | POA: Diagnosis not present

## 2016-11-12 DIAGNOSIS — Z363 Encounter for antenatal screening for malformations: Secondary | ICD-10-CM | POA: Diagnosis not present

## 2017-01-04 DIAGNOSIS — O99810 Abnormal glucose complicating pregnancy: Secondary | ICD-10-CM | POA: Diagnosis not present

## 2017-01-18 DIAGNOSIS — Z23 Encounter for immunization: Secondary | ICD-10-CM | POA: Diagnosis not present

## 2017-01-18 DIAGNOSIS — O24410 Gestational diabetes mellitus in pregnancy, diet controlled: Secondary | ICD-10-CM | POA: Diagnosis not present

## 2017-01-18 DIAGNOSIS — Z3A28 28 weeks gestation of pregnancy: Secondary | ICD-10-CM | POA: Diagnosis not present

## 2017-01-18 DIAGNOSIS — Z362 Encounter for other antenatal screening follow-up: Secondary | ICD-10-CM | POA: Diagnosis not present

## 2017-01-25 MED FILL — PRENATAL VITAMIN PLUS LOW I: 27-1 | 90 days supply | Qty: 90 | Fill #3

## 2017-02-15 DIAGNOSIS — Z3A32 32 weeks gestation of pregnancy: Secondary | ICD-10-CM | POA: Diagnosis not present

## 2017-02-15 DIAGNOSIS — O24415 Gestational diabetes mellitus in pregnancy, controlled by oral hypoglycemic drugs: Secondary | ICD-10-CM | POA: Diagnosis not present

## 2017-02-18 DIAGNOSIS — Z3A32 32 weeks gestation of pregnancy: Secondary | ICD-10-CM | POA: Diagnosis not present

## 2017-02-18 DIAGNOSIS — O24410 Gestational diabetes mellitus in pregnancy, diet controlled: Secondary | ICD-10-CM | POA: Diagnosis not present

## 2017-02-22 DIAGNOSIS — O24414 Gestational diabetes mellitus in pregnancy, insulin controlled: Secondary | ICD-10-CM | POA: Diagnosis not present

## 2017-02-22 DIAGNOSIS — Z3A33 33 weeks gestation of pregnancy: Secondary | ICD-10-CM | POA: Diagnosis not present

## 2017-02-22 MED FILL — LANTUS 100 UNITS/ML VIAL: 100 | 22 days supply | Qty: 20 | Fill #0

## 2017-02-25 DIAGNOSIS — O3663X1 Maternal care for excessive fetal growth, third trimester, fetus 1: Secondary | ICD-10-CM | POA: Diagnosis not present

## 2017-02-25 DIAGNOSIS — Z3A33 33 weeks gestation of pregnancy: Secondary | ICD-10-CM | POA: Diagnosis not present

## 2017-02-25 DIAGNOSIS — O24415 Gestational diabetes mellitus in pregnancy, controlled by oral hypoglycemic drugs: Secondary | ICD-10-CM | POA: Diagnosis not present

## 2017-02-25 DIAGNOSIS — O24419 Gestational diabetes mellitus in pregnancy, unspecified control: Secondary | ICD-10-CM | POA: Diagnosis not present

## 2017-03-01 DIAGNOSIS — Z3A34 34 weeks gestation of pregnancy: Secondary | ICD-10-CM | POA: Diagnosis not present

## 2017-03-01 DIAGNOSIS — O24410 Gestational diabetes mellitus in pregnancy, diet controlled: Secondary | ICD-10-CM | POA: Diagnosis not present

## 2017-03-04 ENCOUNTER — Other Ambulatory Visit (HOSPITAL_COMMUNITY): Payer: Self-pay | Admitting: Obstetrics and Gynecology

## 2017-03-04 DIAGNOSIS — Z3A34 34 weeks gestation of pregnancy: Secondary | ICD-10-CM | POA: Diagnosis not present

## 2017-03-04 DIAGNOSIS — R9389 Abnormal findings on diagnostic imaging of other specified body structures: Secondary | ICD-10-CM

## 2017-03-04 DIAGNOSIS — O24414 Gestational diabetes mellitus in pregnancy, insulin controlled: Secondary | ICD-10-CM | POA: Diagnosis not present

## 2017-03-04 DIAGNOSIS — Z3A35 35 weeks gestation of pregnancy: Secondary | ICD-10-CM

## 2017-03-04 DIAGNOSIS — Z3689 Encounter for other specified antenatal screening: Secondary | ICD-10-CM

## 2017-03-08 DIAGNOSIS — Z3A35 35 weeks gestation of pregnancy: Secondary | ICD-10-CM | POA: Diagnosis not present

## 2017-03-08 DIAGNOSIS — Z113 Encounter for screening for infections with a predominantly sexual mode of transmission: Secondary | ICD-10-CM | POA: Diagnosis not present

## 2017-03-08 DIAGNOSIS — O24414 Gestational diabetes mellitus in pregnancy, insulin controlled: Secondary | ICD-10-CM | POA: Diagnosis not present

## 2017-03-08 DIAGNOSIS — Z3685 Encounter for antenatal screening for Streptococcus B: Secondary | ICD-10-CM | POA: Diagnosis not present

## 2017-03-09 LAB — OB RESULTS CONSOLE GBS: GBS: NEGATIVE

## 2017-03-11 ENCOUNTER — Encounter (HOSPITAL_COMMUNITY): Payer: Self-pay | Admitting: *Deleted

## 2017-03-11 MED FILL — LANTUS 100 UNITS/ML VIAL: 100 | 22 days supply | Qty: 20 | Fill #1

## 2017-03-12 ENCOUNTER — Encounter (HOSPITAL_COMMUNITY): Payer: Self-pay

## 2017-03-12 ENCOUNTER — Ambulatory Visit (HOSPITAL_COMMUNITY)
Admission: RE | Admit: 2017-03-12 | Discharge: 2017-03-12 | Disposition: A | Discharge status: Home / Self Care | Payer: 59 | Source: Ambulatory Visit | Attending: Obstetrics and Gynecology | Admitting: Obstetrics and Gynecology

## 2017-03-12 DIAGNOSIS — O99283 Endocrine, nutritional and metabolic diseases complicating pregnancy, third trimester: Secondary | ICD-10-CM | POA: Insufficient documentation

## 2017-03-12 DIAGNOSIS — E039 Hypothyroidism, unspecified: Secondary | ICD-10-CM | POA: Insufficient documentation

## 2017-03-12 DIAGNOSIS — Z3A35 35 weeks gestation of pregnancy: Secondary | ICD-10-CM | POA: Diagnosis not present

## 2017-03-12 DIAGNOSIS — Z3689 Encounter for other specified antenatal screening: Secondary | ICD-10-CM

## 2017-03-12 DIAGNOSIS — O24419 Gestational diabetes mellitus in pregnancy, unspecified control: Secondary | ICD-10-CM | POA: Diagnosis not present

## 2017-03-12 DIAGNOSIS — O359XX0 Maternal care for (suspected) fetal abnormality and damage, unspecified, not applicable or unspecified: Secondary | ICD-10-CM | POA: Insufficient documentation

## 2017-03-12 DIAGNOSIS — R9389 Abnormal findings on diagnostic imaging of other specified body structures: Secondary | ICD-10-CM

## 2017-03-12 DIAGNOSIS — O9989 Other specified diseases and conditions complicating pregnancy, childbirth and the puerperium: Secondary | ICD-10-CM | POA: Diagnosis not present

## 2017-03-12 DIAGNOSIS — R938 Abnormal findings on diagnostic imaging of other specified body structures: Secondary | ICD-10-CM | POA: Diagnosis not present

## 2017-03-15 DIAGNOSIS — O24414 Gestational diabetes mellitus in pregnancy, insulin controlled: Secondary | ICD-10-CM | POA: Diagnosis not present

## 2017-03-15 DIAGNOSIS — O24419 Gestational diabetes mellitus in pregnancy, unspecified control: Secondary | ICD-10-CM | POA: Diagnosis not present

## 2017-03-15 DIAGNOSIS — Z3A36 36 weeks gestation of pregnancy: Secondary | ICD-10-CM | POA: Diagnosis not present

## 2017-03-18 DIAGNOSIS — Z3A36 36 weeks gestation of pregnancy: Secondary | ICD-10-CM | POA: Diagnosis not present

## 2017-03-18 DIAGNOSIS — O24414 Gestational diabetes mellitus in pregnancy, insulin controlled: Secondary | ICD-10-CM | POA: Diagnosis not present

## 2017-03-19 ENCOUNTER — Encounter (HOSPITAL_COMMUNITY): Payer: Self-pay

## 2017-03-22 DIAGNOSIS — Z3A37 37 weeks gestation of pregnancy: Secondary | ICD-10-CM | POA: Diagnosis not present

## 2017-03-22 DIAGNOSIS — O24414 Gestational diabetes mellitus in pregnancy, insulin controlled: Secondary | ICD-10-CM | POA: Diagnosis not present

## 2017-03-25 ENCOUNTER — Encounter (HOSPITAL_COMMUNITY): Payer: Self-pay | Admitting: *Deleted

## 2017-03-25 ENCOUNTER — Inpatient Hospital Stay (HOSPITAL_COMMUNITY)
Admission: AD | Admit: 2017-03-25 | Discharge: 2017-03-28 | DRG: 775 | Disposition: A | Discharge status: Home / Self Care | Payer: 59 | Source: Ambulatory Visit | Attending: Obstetrics and Gynecology | Admitting: Obstetrics and Gynecology

## 2017-03-25 DIAGNOSIS — O99214 Obesity complicating childbirth: Secondary | ICD-10-CM | POA: Diagnosis present

## 2017-03-25 DIAGNOSIS — Z8249 Family history of ischemic heart disease and other diseases of the circulatory system: Secondary | ICD-10-CM

## 2017-03-25 DIAGNOSIS — Z833 Family history of diabetes mellitus: Secondary | ICD-10-CM | POA: Diagnosis not present

## 2017-03-25 DIAGNOSIS — O133 Gestational [pregnancy-induced] hypertension without significant proteinuria, third trimester: Secondary | ICD-10-CM | POA: Diagnosis present

## 2017-03-25 DIAGNOSIS — Z6841 Body Mass Index (BMI) 40.0 and over, adult: Secondary | ICD-10-CM | POA: Diagnosis not present

## 2017-03-25 DIAGNOSIS — O24425 Gestational diabetes mellitus in childbirth, controlled by oral hypoglycemic drugs: Secondary | ICD-10-CM | POA: Diagnosis present

## 2017-03-25 DIAGNOSIS — O163 Unspecified maternal hypertension, third trimester: Secondary | ICD-10-CM

## 2017-03-25 DIAGNOSIS — O134 Gestational [pregnancy-induced] hypertension without significant proteinuria, complicating childbirth: Secondary | ICD-10-CM | POA: Diagnosis not present

## 2017-03-25 DIAGNOSIS — Z87891 Personal history of nicotine dependence: Secondary | ICD-10-CM | POA: Diagnosis not present

## 2017-03-25 DIAGNOSIS — Z3A37 37 weeks gestation of pregnancy: Secondary | ICD-10-CM | POA: Diagnosis not present

## 2017-03-25 DIAGNOSIS — O24414 Gestational diabetes mellitus in pregnancy, insulin controlled: Secondary | ICD-10-CM

## 2017-03-25 LAB — URINALYSIS, ROUTINE W REFLEX MICROSCOPIC
BILIRUBIN URINE: NEGATIVE
Glucose, UA: NEGATIVE mg/dL
HGB URINE DIPSTICK: NEGATIVE
KETONES UR: NEGATIVE mg/dL
Leukocytes, UA: NEGATIVE
NITRITE: NEGATIVE
Protein, ur: NEGATIVE mg/dL
Specific Gravity, Urine: 1.016 (ref 1.005–1.030)
pH: 6 (ref 5.0–8.0)

## 2017-03-25 LAB — GLUCOSE, CAPILLARY
GLUCOSE-CAPILLARY: 162 mg/dL — AB (ref 65–99)
GLUCOSE-CAPILLARY: 77 mg/dL (ref 65–99)

## 2017-03-25 LAB — COMPREHENSIVE METABOLIC PANEL
ALBUMIN: 2.9 g/dL — AB (ref 3.5–5.0)
ALK PHOS: 129 U/L — AB (ref 38–126)
ALT: 13 U/L — ABNORMAL LOW (ref 14–54)
ANION GAP: 8 (ref 5–15)
AST: 17 U/L (ref 15–41)
BILIRUBIN TOTAL: 0.2 mg/dL — AB (ref 0.3–1.2)
BUN: 7 mg/dL (ref 6–20)
CALCIUM: 8.8 mg/dL — AB (ref 8.9–10.3)
CO2: 22 mmol/L (ref 22–32)
Chloride: 104 mmol/L (ref 101–111)
Creatinine, Ser: 0.52 mg/dL (ref 0.44–1.00)
GFR calc non Af Amer: >60 mL/min (ref >60)
GLUCOSE: 112 mg/dL — AB (ref 65–99)
POTASSIUM: 3.8 mmol/L (ref 3.5–5.1)
SODIUM: 134 mmol/L — AB (ref 135–145)
TOTAL PROTEIN: 6.5 g/dL (ref 6.5–8.1)

## 2017-03-25 LAB — PROTEIN / CREATININE RATIO, URINE
Creatinine, Urine: 121 mg/dL
PROTEIN CREATININE RATIO: 0.06 mg/mg{creat} (ref 0.00–0.15)
TOTAL PROTEIN, URINE: 7 mg/dL

## 2017-03-25 LAB — CBC
HCT: 37.5 % (ref 36.0–46.0)
Hemoglobin: 12.8 g/dL (ref 12.0–15.0)
MCH: 28.4 pg (ref 26.0–34.0)
MCHC: 34.1 g/dL (ref 30.0–36.0)
MCV: 83.3 fL (ref 78.0–100.0)
Platelets: 171 10*3/uL (ref 150–400)
RBC: 4.5 MIL/uL (ref 3.87–5.11)
RDW: 13.6 % (ref 11.5–15.5)
WBC: 8.2 10*3/uL (ref 4.0–10.5)

## 2017-03-25 LAB — TROPONIN I

## 2017-03-25 LAB — TYPE AND SCREEN
ABO/RH(D): O POS
ANTIBODY SCREEN: NEGATIVE

## 2017-03-25 MED ORDER — OXYTOCIN 40 UNITS IN LACTATED RINGERS INFUSION - SIMPLE MED
2.5000 [IU]/h | INTRAVENOUS | Status: DC
  Administered 2017-03-26: 2.5 [IU]/h via INTRAVENOUS
  Filled 2017-03-25: qty 1000

## 2017-03-25 MED ORDER — ONDANSETRON HCL 4 MG/2ML IJ SOLN: 4.0000 mg | Freq: Four times a day (QID) | INTRAMUSCULAR | Status: DC | PRN

## 2017-03-25 MED ORDER — OXYCODONE-ACETAMINOPHEN 5-325 MG PO TABS: 1.0000 | ORAL_TABLET | ORAL | Status: DC | PRN

## 2017-03-25 MED ORDER — BUTORPHANOL TARTRATE 1 MG/ML IJ SOLN: 1.0000 mg | INTRAMUSCULAR | Status: DC | PRN

## 2017-03-25 MED ORDER — LACTATED RINGERS IV SOLN
INTRAVENOUS | Status: DC
  Administered 2017-03-25 – 2017-03-26 (×4): via INTRAVENOUS

## 2017-03-25 MED ORDER — LIDOCAINE HCL (PF) 1 % IJ SOLN
30.0000 mL | INTRAMUSCULAR | Status: DC | PRN
  Filled 2017-03-25: qty 30

## 2017-03-25 MED ORDER — MISOPROSTOL 25 MCG QUARTER TABLET
25.0000 ug | ORAL_TABLET | ORAL | Status: DC | PRN
  Administered 2017-03-25 – 2017-03-26 (×2): 25 ug via VAGINAL
  Filled 2017-03-25 (×4): qty 1

## 2017-03-25 MED ORDER — OXYTOCIN BOLUS FROM INFUSION
500.0000 mL | Freq: Once | INTRAVENOUS | Status: AC
  Administered 2017-03-26: 500 mL via INTRAVENOUS

## 2017-03-25 MED ORDER — SOD CITRATE-CITRIC ACID 500-334 MG/5ML PO SOLN: 30.0000 mL | ORAL | Status: DC | PRN

## 2017-03-25 MED ORDER — TERBUTALINE SULFATE 1 MG/ML IJ SOLN
0.2500 mg | Freq: Once | INTRAMUSCULAR | Status: DC | PRN
  Filled 2017-03-25: qty 1

## 2017-03-25 MED ORDER — LACTATED RINGERS IV SOLN: 500.0000 mL | INTRAVENOUS | Status: DC | PRN

## 2017-03-25 MED ORDER — ACETAMINOPHEN 325 MG PO TABS: 650.0000 mg | ORAL_TABLET | ORAL | Status: DC | PRN

## 2017-03-25 MED ORDER — OXYCODONE-ACETAMINOPHEN 5-325 MG PO TABS: 2.0000 | ORAL_TABLET | ORAL | Status: DC | PRN

## 2017-03-25 NOTE — H&P (Signed)
EARLY ORD is a 26 y.o. female G3P1011 at 26 5/7 weeks (EDD 04/10/17 by LMP c/w 10 week [place])  presenting for IOL given elevated BP c/w gestational hypertension.  Pt seen in office today with c/o chest pain and sent to MAU.  EKG slightly abnormal but cardiology felt artifact from lead placement and recommended if troponin negative no further w/u.  O2 saturation 100%.  Pt described pain as episodic every 20 min lasting 1 minute.  Other evaluation revealed some labile blood pressures with a few in severe range, but most 130-140/90's.  PIH labs WNL and prot:creatinine ration WNL,.  Prenatal care otherwise complicated by: 1)  Increased Down's risk on first trimester screen of 1:29, but subsequent low risk      Panorama 2)  Gestational diabetes well-controlled on metformin 500mg  po BID and lantus            100units q day 3)  H/o shoulder dystocia of 9#1oz baby, no sequela.  EFW this pregnancy on              03/15/17 normal range at 7#1oz 4)  H/o hypothyroidism   OB History    Gravida Para Term Preterm AB Living   3 1 1   1 1    SAB TAB Ectopic Multiple Live Births   1       1    2014  Vacuum extraction 9#1oz --one minute shoulder dystocia SAB x 1  Past Medical History:  Diagnosis Date  . ADHD (attention deficit hyperactivity disorder)   . Diabetes mellitus without complication (HCC)    GDM  . Gestational diabetes   . History of kidney stones   . Hypothyroidism    last check normal   Past Surgical History:  Procedure Laterality Date  . LITHOTRIPSY    . TYMPANOSTOMY TUBE PLACEMENT     Family History: family history includes Asthma in her father and sister; COPD in her father; Cancer in her maternal grandmother; Diabetes in her father and mother; Heart disease in her maternal grandmother; Hyperlipidemia in her father and mother; Hypertension in her maternal grandmother; Kidney disease in her maternal grandmother. Social History:  reports that she quit smoking about 4 years ago. She has  never used smokeless tobacco. She reports that she does not drink alcohol or use drugs.     Maternal Diabetes: Yes:  Diabetes Type:  Insulin/Medication controlled Genetic Screening: Abnormal:  Results: Elevated risk of Trisomy 21 on first trimester screen   Panorama low risk Maternal Ultrasounds/Referrals: Normal Fetal Ultrasounds or other Referrals:  Referred to Materal Fetal Medicine f/u cranial anatomy WNL Maternal Substance Abuse:  No Significant Maternal Medications:  Meds include: Other: metformin, insulin Significant Maternal Lab Results:  Lab values include: Group B Strep negative Other Comments:  None  Review of Systems  Cardiovascular: Positive for chest pain.  Gastrointestinal: Negative for nausea.  Neurological: Negative for headaches.   Maternal Medical History:  Reason for admission: Nausea.  Contractions: Frequency: rare.   Perceived severity is mild.    Fetal activity: Perceived fetal activity is normal.    Prenatal complications: PIH.   Prenatal Complications - Diabetes: gestational. Diabetes is managed by insulin injections and oral agent (monotherapy).      Dilation: 1.5 Effacement (%): 50 Station: -3 Exam by:: Kayren Eaves RN  Blood pressure (!) 149/90, pulse (!) 112, temperature 99 F (37.2 C), temperature source Oral, resp. rate 19, height 5\' 3"  (1.6 m), weight 110.2 kg (243 lb), last menstrual  period 07/04/2016, SpO2 98 %, unknown if currently breastfeeding. Maternal Exam:  Uterine Assessment: Contraction strength is mild.  Contraction frequency is rare.   Abdomen: Patient reports no abdominal tenderness. Fetal presentation: vertex  Introitus: Normal vulva. Normal vagina.    Physical Exam  Constitutional: She appears well-developed and well-nourished.  Cardiovascular: Normal rate and regular rhythm.   Respiratory: Effort normal.  GI: Soft.  Genitourinary: Vagina normal.  Neurological: She is alert.  Psychiatric: She has a normal mood and  affect.    Prenatal labs: ABO, Rh:  O positive Antibody:  Neg Rubella:  Immune RPR:   NR HBsAg:  Neg HIV:   NR GBS:  Neg One hour GCT 146 early, nml 3 hour GTT except one high value Three hour GTT repeated at 28 weeks 90/194/168/159  Assessment/Plan: Pt with elevated BP c/w gestational hypertension but no preeclamptic features.  Chest pain w/u essentially benign and pain sounds more episodic and maybe associated with contractions.  TOL reasonable with h/o shoulder dystocia as this baby EFW smaller at 7#1oz less than 2 weeks ago.  Cervix not very favorable so will plan ripening with cytotec.  Oliver PilaKathy W Alfreda Hammad 03/25/2017, 7:53 PM

## 2017-03-25 NOTE — MAU Provider Note (Signed)
History     CSN: [id number]  Arrival date and time: 03/25/17 1610  First Provider Initiated Contact with Patient 03/25/17 1657      Chief Complaint  Patient presents with  . Chest Pain   HPI Holly Solis is a 26 y.o. G3P1011 at 2424w5d who presents with chest pain. Symptoms began last night. Reports intermittent substernal chest pressure. Pain occurs every 20 minutes & lasts for 1 minute at a time. Pain does not radiate. Denies cardiac history. Associated symptoms include SOB, increased heart rate, & abdominal tightening. Denies headache, visual disturbance, abdominal pain, n/v. Positive fetal movement. Denies hx of hypertension. GDM with current pregnancy, using insulin. Ate a cheeseburger prior to coming to MAU.  OB History    Gravida Para Term Preterm AB Living   3 1 1   1 1    SAB TAB Ectopic Multiple Live Births   1       1      Past Medical History:  Diagnosis Date  . ADHD (attention deficit hyperactivity disorder)   . Diabetes mellitus without complication (HCC)    GDM  . Gestational diabetes   . History of kidney stones   . Hypothyroidism    last check normal    Past Surgical History:  Procedure Laterality Date  . LITHOTRIPSY    . TYMPANOSTOMY TUBE PLACEMENT      Family History  Problem Relation Age of Onset  . Diabetes Mother   . Hyperlipidemia Mother   . Asthma Father   . COPD Father   . Diabetes Father   . Hyperlipidemia Father   . Asthma Sister   . Cancer Maternal Grandmother   . Heart disease Maternal Grandmother   . Hypertension Maternal Grandmother   . Kidney disease Maternal Grandmother     Social History  Substance Use Topics  . Smoking status: Former Smoker    Quit date: 01/22/2013  . Smokeless tobacco: Never Used  . Alcohol use No    Allergies: No Known Allergies  Prescriptions Prior to Admission  Medication Sig Dispense Refill Last Dose  . HYDROcodone-acetaminophen (NORCO/VICODIN) 5-325 MG tablet Take 1 tablet by mouth every 6  (six) hours as needed for moderate pain or severe pain. (Patient not taking: Reported on 03/12/2017) 6 tablet 0 Not Taking  . ibuprofen (ADVIL,MOTRIN) 600 MG tablet Take 1 tablet (600 mg total) by mouth every 6 (six) hours. (Patient not taking: Reported on 03/12/2017) 30 tablet 0 Not Taking  . Insulin Glargine (LANTUS [place]) Inject into the skin.   Taking  . ondansetron (ZOFRAN-ODT) 4 MG disintegrating tablet Take 1 tablet (4 mg total) by mouth every 8 (eight) hours as needed for nausea or vomiting. (Patient not taking: Reported on 03/12/2017) 20 tablet 0 Not Taking  . oxyCODONE-acetaminophen (PERCOCET/ROXICET) 5-325 MG per tablet Take 1-2 tablets by mouth every 4 (four) hours as needed for severe pain (moderate - severe pain). (Patient not taking: Reported on 03/12/2017) 20 tablet 0 Not Taking  . Prenatal Vit-Fe Fumarate-FA (PRENATAL MULTIVITAMIN) TABS tablet Take 1 tablet by mouth daily at 12 noon.   Taking  . sulfamethoxazole-trimethoprim (BACTRIM DS,SEPTRA DS) 800-160 MG tablet Take 1 tablet by mouth 2 (two) times daily. (Patient not taking: Reported on 03/12/2017) 9 tablet 0 Not Taking    Review of Systems  Constitutional: Negative.   Eyes: Negative for visual disturbance.  Respiratory: Positive for chest tightness and shortness of breath.   Cardiovascular: Positive for chest pain and palpitations.  Gastrointestinal: Negative.  Genitourinary: Negative.   Neurological: Negative for dizziness and headaches.   Physical Exam  Dilation: 1.5 Effacement (%): 50 Cervical Position: Middle Station: -3 Presentation: Vertex Exam by:: Kayren Eaves RN    Blood pressure (!) 144/84, pulse (!) 116, temperature 99 F (37.2 C), temperature source Oral, resp. rate 19, height 5\' 3"  (1.6 m), weight 243 lb (110.2 kg), last menstrual period 07/04/2016, SpO2 98 %, unknown if currently breastfeeding.  Temp:  [99 F (37.2 C)] 99 F (37.2 C) (05/24 1632) Pulse Rate:  [108-122] 116 (05/24 1901) Resp:  [19] 19  (05/24 1632) BP: (133-166)/(76-120) 144/84 (05/24 1901) SpO2:  [97 %-100 %] 98 % (05/24 1846) Weight:  [243 lb (110.2 kg)] 243 lb (110.2 kg) (05/24 1627)   Physical Exam  Nursing note and vitals reviewed. Constitutional: She is oriented to person, place, and time. She appears well-developed and well-nourished. No distress.  HENT:  Head: Normocephalic and atraumatic.  Eyes: Conjunctivae are normal. Right eye exhibits no discharge. Left eye exhibits no discharge. No scleral icterus.  Neck: Normal range of motion.  Cardiovascular: Regular rhythm and normal heart sounds.  Tachycardia present.   No murmur heard. Respiratory: Effort normal and breath sounds normal. No respiratory distress. She has no wheezes.  GI: Soft. Bowel sounds are normal. There is no tenderness.  Neurological: She is alert and oriented to person, place, and time.  Skin: Skin is warm and dry. She is not diaphoretic.  Psychiatric: She has a normal mood and affect. Her behavior is normal. Judgment and thought content normal.   Fetal Tracing:  Baseline: 135 Variability: moderate Accelerations: 15x15 Decelerations: none  Toco: irr ctx MAU Course  Procedures Results for orders placed or performed during the hospital encounter of 03/25/17 (from the past 24 hour(s))  Glucose, capillary     Status: Abnormal   Collection Time: 03/25/17  5:13 PM  Result Value Ref Range   Glucose-Capillary 162 (H) 65 - 99 mg/dL  Urinalysis, Routine w reflex microscopic     Status: Abnormal   Collection Time: 03/25/17  5:40 PM  Result Value Ref Range   Color, Urine YELLOW YELLOW   APPearance HAZY (A) CLEAR   Specific Gravity, Urine 1.016 1.005 - 1.030   pH 6.0 5.0 - 8.0   Glucose, UA NEGATIVE NEGATIVE mg/dL   Hgb urine dipstick NEGATIVE NEGATIVE   Bilirubin Urine NEGATIVE NEGATIVE   Ketones, ur NEGATIVE NEGATIVE mg/dL   Protein, ur NEGATIVE NEGATIVE mg/dL   Nitrite NEGATIVE NEGATIVE   Leukocytes, UA NEGATIVE NEGATIVE  Protein /  creatinine ratio, urine     Status: None   Collection Time: 03/25/17  5:40 PM  Result Value Ref Range   Creatinine, Urine 121.00 mg/dL   Total Protein, Urine 7 mg/dL   Protein Creatinine Ratio 0.06 0.00 - 0.15 mg/mg[Cre]  CBC     Status: None   Collection Time: 03/25/17  5:41 PM  Result Value Ref Range   WBC 8.2 4.0 - 10.5 K/uL   RBC 4.50 3.87 - 5.11 MIL/uL   Hemoglobin 12.8 12.0 - 15.0 g/dL   HCT 69.6 29.5 - 28.4 %   MCV 83.3 78.0 - 100.0 fL   MCH 28.4 26.0 - 34.0 pg   MCHC 34.1 30.0 - 36.0 g/dL   RDW 13.2 44.0 - 10.2 %   Platelets 171 150 - 400 K/uL  Comprehensive metabolic panel     Status: Abnormal   Collection Time: 03/25/17  5:41 PM  Result Value Ref Range  Sodium 134 (L) 135 - 145 mmol/L   Potassium 3.8 3.5 - 5.1 mmol/L   Chloride 104 101 - 111 mmol/L   CO2 22 22 - 32 mmol/L   Glucose, Bld 112 (H) 65 - 99 mg/dL   BUN 7 6 - 20 mg/dL   Creatinine, Ser 1.61 0.44 - 1.00 mg/dL   Calcium 8.8 (L) 8.9 - 10.3 mg/dL   Total Protein 6.5 6.5 - 8.1 g/dL   Albumin 2.9 (L) 3.5 - 5.0 g/dL   AST 17 15 - 41 U/L   ALT 13 (L) 14 - 54 U/L   Alkaline Phosphatase 129 (H) 38 - 126 U/L   Total Bilirubin 0.2 (L) 0.3 - 1.2 mg/dL   GFR calc non Af Amer >60 >60 mL/min   GFR calc Af Amer >60 >60 mL/min   Anion gap 8 5 - 15  Troponin I     Status: None   Collection Time: 03/25/17  5:47 PM  Result Value Ref Range   Troponin I <0.03 <0.03 ng/mL    MDM Reactive fetal tracing EKG -- reviewed with Dr. Algie Coffer (cardiology). Reading likely d/t lead placement but will order troponin to be thorough.  Pt asymptomatic while I'm evaluating her; states pain only occurs 2-3 times per hour.  Elevated BPs -- PIH labs ordered S/w Dr. Senaida Ores. Will continue to monitor BPs.  Per Dr. Senaida Ores will admit to induce labor d/t elevated BPs.  Assessment and Plan  A: 1. Hypertension affecting pregnancy in third trimester   2. Insulin controlled gestational diabetes mellitus (GDM) in third trimester     P: Admit for IOL per Dr. Doristine Section 03/25/2017, 4:57 PM

## 2017-03-25 NOTE — MAU Note (Signed)
Pt presents with complaint of chest pain off/on since yesterday. Spoke with provider yesterday. States it feels like pressure in her upper chest. Denies cough or fever.

## 2017-03-26 ENCOUNTER — Inpatient Hospital Stay (HOSPITAL_COMMUNITY): Payer: 59 | Admitting: Anesthesiology

## 2017-03-26 ENCOUNTER — Encounter (HOSPITAL_COMMUNITY): Payer: Self-pay | Admitting: Anesthesiology

## 2017-03-26 LAB — GLUCOSE, CAPILLARY
GLUCOSE-CAPILLARY: 68 mg/dL (ref 65–99)
GLUCOSE-CAPILLARY: 61 mg/dL — AB (ref 65–99)
GLUCOSE-CAPILLARY: 158 mg/dL — AB (ref 65–99)
Glucose-Capillary: 72 mg/dL (ref 65–99)
Glucose-Capillary: 65 mg/dL (ref 65–99)

## 2017-03-26 LAB — SYPHILIS: RPR W/REFLEX TO RPR TITER AND TREPONEMAL ANTIBODIES, TRADITIONAL SCREENING AND DIAGNOSIS ALGORITHM: RPR Ser Ql: NONREACTIVE

## 2017-03-26 MED ORDER — LACTATED RINGERS IV SOLN
500.0000 mL | Freq: Once | INTRAVENOUS | Status: AC
  Administered 2017-03-26: 500 mL via INTRAVENOUS

## 2017-03-26 MED ORDER — OXYTOCIN 40 UNITS IN LACTATED RINGERS INFUSION - SIMPLE MED: 1.0000 m[IU]/min | INTRAVENOUS | Status: DC

## 2017-03-26 MED ORDER — FENTANYL 2.5 MCG/ML BUPIVACAINE 1/10 % EPIDURAL INFUSION (WH - ANES)
14.0000 mL/h | INTRAMUSCULAR | Status: DC | PRN
  Administered 2017-03-26 (×2): 14 mL/h via EPIDURAL
  Filled 2017-03-26: qty 100

## 2017-03-26 MED ORDER — ONDANSETRON HCL 4 MG/2ML IJ SOLN: 4.0000 mg | INTRAMUSCULAR | Status: DC | PRN

## 2017-03-26 MED ORDER — FENTANYL 2.5 MCG/ML BUPIVACAINE 1/10 % EPIDURAL INFUSION (WH - ANES)
INTRAMUSCULAR | Status: AC
  Filled 2017-03-26: qty 100

## 2017-03-26 MED ORDER — PHENYLEPHRINE 40 MCG/ML (10ML) SYRINGE FOR IV PUSH (FOR BLOOD PRESSURE SUPPORT)
PREFILLED_SYRINGE | INTRAVENOUS | Status: AC
  Filled 2017-03-26: qty 10

## 2017-03-26 MED ORDER — OXYCODONE HCL 5 MG PO TABS: 10.0000 mg | ORAL_TABLET | ORAL | Status: DC | PRN

## 2017-03-26 MED ORDER — ONDANSETRON HCL 4 MG PO TABS: 4.0000 mg | ORAL_TABLET | ORAL | Status: DC | PRN

## 2017-03-26 MED ORDER — ACETAMINOPHEN 325 MG PO TABS
650.0000 mg | ORAL_TABLET | ORAL | Status: DC | PRN
  Administered 2017-03-27: 650 mg via ORAL
  Filled 2017-03-26: qty 2

## 2017-03-26 MED ORDER — LIDOCAINE HCL (PF) 1 % IJ SOLN
INTRAMUSCULAR | Status: DC | PRN
  Administered 2017-03-26: 5 mL via EPIDURAL
  Administered 2017-03-26: 8 mL via EPIDURAL

## 2017-03-26 MED ORDER — SIMETHICONE 80 MG PO CHEW: 80.0000 mg | CHEWABLE_TABLET | ORAL | Status: DC | PRN

## 2017-03-26 MED ORDER — PRENATAL MULTIVITAMIN CH
1.0000 | ORAL_TABLET | Freq: Every day | ORAL | Status: DC
  Administered 2017-03-27 – 2017-03-28 (×2): 1 via ORAL
  Filled 2017-03-26 (×2): qty 1

## 2017-03-26 MED ORDER — TERBUTALINE SULFATE 1 MG/ML IJ SOLN
0.2500 mg | Freq: Once | INTRAMUSCULAR | Status: DC | PRN
  Filled 2017-03-26: qty 1

## 2017-03-26 MED ORDER — DIBUCAINE 1 % RE OINT: 1.0000 "application " | TOPICAL_OINTMENT | RECTAL | Status: DC | PRN

## 2017-03-26 MED ORDER — IBUPROFEN 600 MG PO TABS
600.0000 mg | ORAL_TABLET | Freq: Four times a day (QID) | ORAL | Status: DC
  Administered 2017-03-26 – 2017-03-28 (×7): 600 mg via ORAL
  Filled 2017-03-26 (×7): qty 1

## 2017-03-26 MED ORDER — BENZOCAINE-MENTHOL 20-0.5 % EX AERO: 1.0000 "application " | INHALATION_SPRAY | CUTANEOUS | Status: DC | PRN

## 2017-03-26 MED ORDER — WITCH HAZEL-GLYCERIN EX PADS: 1.0000 "application " | MEDICATED_PAD | CUTANEOUS | Status: DC | PRN

## 2017-03-26 MED ORDER — METHYLERGONOVINE MALEATE 0.2 MG/ML IJ SOLN: 0.2000 mg | INTRAMUSCULAR | Status: DC | PRN

## 2017-03-26 MED ORDER — ZOLPIDEM TARTRATE 5 MG PO TABS: 5.0000 mg | ORAL_TABLET | Freq: Every evening | ORAL | Status: DC | PRN

## 2017-03-26 MED ORDER — MEASLES, MUMPS & RUBELLA VAC ~~LOC~~ INJ: 0.5000 mL | INJECTION | Freq: Once | SUBCUTANEOUS | Status: DC

## 2017-03-26 MED ORDER — SENNOSIDES-DOCUSATE SODIUM 8.6-50 MG PO TABS
2.0000 | ORAL_TABLET | ORAL | Status: DC
  Administered 2017-03-26 – 2017-03-28 (×2): 2 via ORAL
  Filled 2017-03-26 (×2): qty 2

## 2017-03-26 MED ORDER — METHYLERGONOVINE MALEATE 0.2 MG PO TABS: 0.2000 mg | ORAL_TABLET | ORAL | Status: DC | PRN

## 2017-03-26 MED ORDER — DIPHENHYDRAMINE HCL 25 MG PO CAPS: 25.0000 mg | ORAL_CAPSULE | Freq: Four times a day (QID) | ORAL | Status: DC | PRN

## 2017-03-26 MED ORDER — PHENYLEPHRINE 40 MCG/ML (10ML) SYRINGE FOR IV PUSH (FOR BLOOD PRESSURE SUPPORT)
80.0000 ug | PREFILLED_SYRINGE | INTRAVENOUS | Status: DC | PRN
  Filled 2017-03-26: qty 5

## 2017-03-26 MED ORDER — OXYTOCIN 40 UNITS IN LACTATED RINGERS INFUSION - SIMPLE MED
1.0000 m[IU]/min | INTRAVENOUS | Status: DC
  Administered 2017-03-26: 1 m[IU]/min via INTRAVENOUS

## 2017-03-26 MED ORDER — OXYCODONE HCL 5 MG PO TABS: 5.0000 mg | ORAL_TABLET | ORAL | Status: DC | PRN

## 2017-03-26 MED ORDER — TETANUS-DIPHTH-ACELL PERTUSSIS 5-2.5-18.5 LF-MCG/0.5 IM SUSP: 0.5000 mL | Freq: Once | INTRAMUSCULAR | Status: DC

## 2017-03-26 MED ORDER — DIPHENHYDRAMINE HCL 50 MG/ML IJ SOLN
12.5000 mg | INTRAMUSCULAR | Status: DC | PRN
  Filled 2017-03-26: qty 1

## 2017-03-26 MED ORDER — EPHEDRINE 5 MG/ML INJ
10.0000 mg | INTRAVENOUS | Status: DC | PRN
  Filled 2017-03-26: qty 2

## 2017-03-26 MED ORDER — COCONUT OIL OIL: 1.0000 "application " | TOPICAL_OIL | Status: DC | PRN

## 2017-03-26 MED ORDER — MAGNESIUM HYDROXIDE 400 MG/5ML PO SUSP: 30.0000 mL | ORAL | Status: DC | PRN

## 2017-03-26 NOTE — Progress Notes (Signed)
Patient ID: Holly AmbleJennifer L Solis, female   DOB: 02-13-1991, 26 y.o.   MRN: [id number] Feeling some pressure Afeb, VSS, BP currently normal CBG nl FHT- Cat II, baseline up to 150 from 130, some early and variable decels, mod variability VE-7-8/90/0, vtx Continue pitocin for now and monitor FHT and progress, anticipate SVD

## 2017-03-26 NOTE — Progress Notes (Signed)
Patient ID: Holly AmbleJennifer L Solis, female   DOB: 04/28/1991, 26 y.o.   MRN: [id number] Feeling some ctx Afeb, VSS, BP 120-160/60-100 CBG nl FHT-Cat I, ctx q 2-3 min VE-2-3/70/-2, vtx Will start pitocin, monitor progress, plan AROM when feeling more ctx.  Will check CBG q 3 hrs since nl so far

## 2017-03-26 NOTE — Progress Notes (Signed)
This note also relates to the following rows which could not be included: Dose (milli-units/min) Oxytocin - Cannot attach notes to extension rows Rate (mL/hr) Oxytocin - Cannot attach notes to extension rows Concentration Oxytocin - Cannot attach notes to extension rows  Assumed care of patient at this time. Pitocin was running at 537mu/min upon entering room, had not been documented.  I increased to 8 mu/min at this time.

## 2017-03-26 NOTE — Progress Notes (Signed)
Patient ID: Holly AmbleJennifer L Ditto, female   DOB: 1991/07/30, 26 y.o.   MRN: [id number] Comfortable with epidural Afeb, VSS, BP currently normal CBG nl FHT- Cat I, ctx q 2-3 min VE-3/80/-2, vtx, AROM clear Continue pitocin, monitor progress, anticipate SVD

## 2017-03-26 NOTE — Anesthesia Pain Management Evaluation Note (Signed)
  CRNA Pain Management Visit Note  Patient: Holly AmbleJennifer L Solis, 26 y.o., female  "Hello [facility]. We have an anesthesia team available at all times to provide care throughout the hospital, including epidural management and anesthesia for C-section. I don't know your plan for the delivery whether it a natural birth, water birth, IV sedation, nitrous supplementation, doula or epidural, but we want to meet your pain goals."   1.Was your pain managed to your expectations on prior hospitalizations?   Yes   2.What is your expectation for pain management during this hospitalization?     Epidural  3.How can we help you reach that goal?   Record the patient's initial score and the patient's pain goal.   Pain: 4  Pain Goal: 6 [facility] wants you to be able to say your pain was always managed very well.  Laban EmperorMalinova,Offie Pickron Hristova 03/26/2017

## 2017-03-26 NOTE — Anesthesia Preprocedure Evaluation (Signed)
Anesthesia Evaluation  Patient identified by MRN, date of birth, ID band Patient awake    Reviewed: Allergy & Precautions, Patient's Chart, lab work & pertinent test results  Airway Mallampati: II       Dental no notable dental hx.    Pulmonary former smoker,    Pulmonary exam normal breath sounds clear to auscultation       Cardiovascular Normal cardiovascular exam Rhythm:Regular Rate:Normal     Neuro/Psych    GI/Hepatic negative GI ROS, Neg liver ROS,   Endo/Other  diabetesMorbid obesity  Renal/GU negative Renal ROS  negative genitourinary   Musculoskeletal negative musculoskeletal ROS (+)   Abdominal (+) + obese,   Peds  Hematology   Anesthesia Other Findings   Reproductive/Obstetrics                             Anesthesia Physical Anesthesia Plan  ASA: III  Anesthesia Plan: Epidural   Post-op Pain Management:    Induction:   Airway Management Planned:   Additional Equipment:   Intra-op Plan:   Post-operative Plan:   Informed Consent:   Plan Discussed with:   Anesthesia Plan Comments:         Anesthesia Quick Evaluation

## 2017-03-26 NOTE — Progress Notes (Signed)
Patient ID: Holly AmbleJennifer L Harder, female   DOB: 17-Nov-1990, 26 y.o.   MRN: [id number] Pt received 1 cytotec and when RN went to place second one could not feel vertex Pt feeling minimal cramping  afeb BP 130-150/90-100 FHR category 1  Cervix 2/50/-3 vertex  I placed second cytotec.  Vertex still somewhat high at a -3 station, so not quite low enough for AROM.

## 2017-03-26 NOTE — Anesthesia Procedure Notes (Signed)
Epidural Patient location during procedure: OB Start time: 03/26/2017 10:48 AM End time: 03/26/2017 10:52 AM  Staffing Anesthesiologist: Leilani AbleHATCHETT, Azizah Lisle Performed: anesthesiologist   Preanesthetic Checklist Completed: patient identified, surgical consent, pre-op evaluation, timeout performed, IV checked, risks and benefits discussed and monitors and equipment checked  Epidural Patient position: sitting Prep: site prepped and draped and DuraPrep Patient monitoring: continuous pulse ox and blood pressure Approach: midline Location: L3-L4 Injection technique: LOR air  Needle:  Needle type: Tuohy  Needle gauge: 17 G Needle length: 9 cm and 9 Needle insertion depth: 5 cm cm Catheter type: closed end flexible Catheter size: 19 Gauge Catheter at skin depth: 10 cm Test dose: negative and Other  Assessment Sensory level: T9 Events: blood not aspirated, injection not painful, no injection resistance, negative IV test and no paresthesia  Additional Notes Reason for block:procedure for pain

## 2017-03-27 LAB — GLUCOSE, CAPILLARY
GLUCOSE-CAPILLARY: 104 mg/dL — AB (ref 65–99)
Glucose-Capillary: 126 mg/dL — ABNORMAL HIGH (ref 65–99)

## 2017-03-27 NOTE — Anesthesia Postprocedure Evaluation (Signed)
Anesthesia Post Note  Patient: Holly Solis  Procedure(s) Performed: * No procedures listed *  Patient location during evaluation: Mother Baby Anesthesia Type: Epidural Level of consciousness: awake and alert and oriented Pain management: satisfactory to patient Vital Signs Assessment: post-procedure vital signs reviewed and stable Respiratory status: spontaneous breathing and nonlabored ventilation Cardiovascular status: stable Postop Assessment: no headache, no backache, no signs of nausea or vomiting, adequate PO intake and patient able to bend at knees (patient up walking) Anesthetic complications: no        Last Vitals:  Vitals:   03/26/17 2247 03/27/17 0230  BP: 123/69 121/77  Pulse: (!) 117 94  Resp: 18 18  Temp: 36.9 C 36.9 C    Last Pain:  Vitals:   03/27/17 0619  TempSrc:   PainSc: 4    Pain Goal:                 [name],[name]

## 2017-03-27 NOTE — Lactation Note (Addendum)
This note was copied from a baby's chart. Lactation Consultation Note expereicned BF mom BF her 1st child for 6 weeks. Mom is employee on [employer], has experienced w/latching and pumping. Mom has "V" shaped breast w/everted nipple at bottom end of breast. Compressible w/colostrum.  Mom requested DEBP d/t baby wasn't latching much after birth. Mom pumped collecting 5ml colostrum. Mom knows how to hand express.  Encouraged mom to call for assistance if needed or has questions. Mom has GDM. No complications. Baby Bld glucose WDL. WH/LC brochure given w/resources, support groups and LC services. Patient Name: Holly Baldwin CrownJennifer Greeno WUJWJ'XToday's Date: 03/27/2017 Reason for consult: Initial assessment   Maternal Data Has patient been taught Hand Expression?: Yes  Feeding Feeding Type: Breast Fed Nipple Type: Slow - flow Length of feed: 20 min  LATCH Score/Interventions       Type of Nipple: Everted at rest and after stimulation  Comfort (Breast/Nipple): Soft / non-tender     Hold (Positioning): No assistance needed to correctly position infant at breast.     Lactation Tools Discussed/Used Tools: Pump Breast pump type: Double-Electric Breast Pump Pump Review: Setup, frequency, and cleaning;Milk Storage Initiated by:: RN Date initiated:: 03/26/17   Consult Status Consult Status: Follow-up Date: 03/28/17 Follow-up type: In-patient    Charyl DancerCARVER, Arelyn Gauer G 03/27/2017, 6:23 AM

## 2017-03-27 NOTE — Lactation Note (Addendum)
This note was copied from a baby's chart. Lactation Consultation Note  Patient Name: Holly Baldwin CrownJennifer Solis ZOXWR'UToday's Date: 03/27/2017 Reason for consult: Follow-up assessment  Baby is 21 hours old and has been to the several times and spoon fed EBM via spoon .  Per mom baby recently breast fed 30 mins , swallows noted, and it was comfortable.  Mom denies soreness and feels breast feeding is going well.  Per mom the DEBP was set up and I was pumping after 1st due to the baby being sluggish with Feedings/ spoon fed EBM.  Per mom requesting her [employer] Benefits DEBP Medela for tomorrow for D/C.  LC made a copy of her insurance card and will obtain DEBP from the office for tomorrow,  Mom aware. Insurance card given back to mom after copied.  LC suggested to mom to call on nurses light with next feeding if she wants LC to check latch.    Maternal Data    Feeding Length of feed: 30 min (per mom swallows )  LATCH Score/Interventions ( this Latch score was done by [employer] RN - Dickie LaBrooke Joyce )  Latch: Grasps breast easily, tongue down, lips flanged, rhythmical sucking.  Audible Swallowing: A few with stimulation  Type of Nipple: Everted at rest and after stimulation  Comfort (Breast/Nipple): Soft / non-tender     Hold (Positioning): No assistance needed to correctly position infant at breast. Intervention(s): Breastfeeding basics reviewed  LATCH Score: 9  Lactation Tools Discussed/Used WIC Program: No   Consult Status Consult Status: Follow-up Date: 03/28/17 Follow-up type: In-patient    Matilde SprangMargaret Ann Pio Eatherly 03/27/2017, 2:59 PM

## 2017-03-27 NOTE — Progress Notes (Signed)
Patient ID: Holly AmbleJennifer L Solis, female   DOB: 1991-02-12, 26 y.o.   MRN: [id number] PPD #1 No problems, some back pain Afeb, VSS, BP normal so far since delivery CBG last pm 158 Fundus firm, NT at U-1 Continue routine postpartum care, monitor BP and CBG today

## 2017-03-28 MED ORDER — IBUPROFEN 600 MG PO TABS: 600.0000 mg | ORAL_TABLET | Freq: Four times a day (QID) | ORAL | 0 refills | Status: DC

## 2017-03-28 NOTE — Discharge Summary (Signed)
OB Discharge Summary     Patient Name: Holly Solis DOB: Feb 05, 1991 MRN: [id number]  Date of admission: 03/25/2017 Delivering MD: Lavina HammanMEISINGER, Djeneba Barsch   Date of discharge: 03/28/2017  Admitting diagnosis: 37.5WKS CHEST PAIN Intrauterine pregnancy: 1850w1d     Secondary diagnosis:  Active Problems:   Gestational hypertension w/o significant proteinuria in 3rd trimester      Discharge diagnosis: Term Pregnancy Delivered, Gestational Hypertension and GDM A2                                   Hospital course:  Induction of Labor With Vaginal Delivery   26 y.o. yo G3P1011 at 4750w1d was admitted to the hospital 03/25/2017 for induction of labor.  Indication for induction: Gestational hypertension and A2 DM.  Patient had an uncomplicated labor course as follows:  Received cytotec followed by pitocin Membrane Rupture Time/Date: 12:10 PM ,03/26/2017   Intrapartum Procedures: Episiotomy: None [1]                                         Lacerations:  Periurethral [8]  Patient had delivery of a Viable infant.  Information for the patient's newborn:  Holly Solis, Boy Sala [[id number]][[id number]]  Delivery Method: Vag-Spont   03/26/2017  Details of delivery can be found in separate delivery note.  Patient had a routine postpartum course, BP remained stable on no meds, CBG was ok off insulin. Patient is discharged home 03/28/17.  Physical exam  Vitals:   03/27/17 0230 03/27/17 0930 03/27/17 1732 03/28/17 0607  BP: 121/77 134/73 134/71 126/78  Pulse: 94 89 89 91  Resp: 18 18 18 18   Temp: 98.4 F (36.9 C) 98.4 F (36.9 C) 98.2 F (36.8 C)   TempSrc: Oral Oral    SpO2:  99%    Weight:      Height:       General: alert Lochia: appropriate Uterine Fundus: firm  Labs: Lab Results  Component Value Date   WBC 8.2 03/25/2017   HGB 12.8 03/25/2017   HCT 37.5 03/25/2017   MCV 83.3 03/25/2017   PLT 171 03/25/2017   CMP Latest Ref Rng & Units 03/25/2017  Glucose 65 - 99 mg/dL 782(N112(H)  BUN 6 - 20  mg/dL 7  Creatinine 5.620.44 - 1.301.00 mg/dL 8.650.52  Sodium 784135 - 696145 mmol/L 134(L)  Potassium 3.5 - 5.1 mmol/L 3.8  Chloride 101 - 111 mmol/L 104  CO2 22 - 32 mmol/L 22  Calcium 8.9 - 10.3 mg/dL 2.9(B8.8(L)  Total Protein 6.5 - 8.1 g/dL 6.5  Total Bilirubin 0.3 - 1.2 mg/dL 2.8(U0.2(L)  Alkaline Phos 38 - 126 U/L 129(H)  AST 15 - 41 U/L 17  ALT 14 - 54 U/L 13(L)    Discharge instruction: per After Visit Summary and "Baby and Me Booklet".  After visit meds:  Allergies as of 03/28/2017   No Known Allergies     Medication List    STOP taking these medications   insulin glargine 100 UNIT/ML injection Commonly known as:  LANTUS   ranitidine 150 MG tablet Commonly known as:  ZANTAC     TAKE these medications   acetaminophen 500 MG tablet Commonly known as:  TYLENOL Take 500 mg by mouth every 6 (six) hours as needed.   ibuprofen 600 MG tablet Commonly known as:  ADVIL,MOTRIN  Take 1 tablet (600 mg total) by mouth every 6 (six) hours.   prenatal multivitamin Tabs tablet Take 1 tablet by mouth daily at 12 noon.       Diet: routine diet  Activity: Advance as tolerated. Pelvic rest for 6 weeks.   Outpatient follow up:one week  Newborn Data: Live born female  Birth Weight: 7 lb 15.5 oz (3615 g) APGAR: 8, 9  Baby Feeding: Breast Disposition:home with mother   03/28/2017 Zenaida Niece, MD

## 2017-03-28 NOTE — Lactation Note (Signed)
This note was copied from a baby's chart. Lactation Consultation Note  Patient Name: Holly Baldwin CrownJennifer Solis HYQMV'HToday's Date: 03/28/2017 Reason for consult: Follow-up assessment;Infant weight loss Baby is 40 hours old  6% weight loss, LC reviewed doc flow sheets and updated.  Per mom the plan is to repeat the Bilirubin. Mom denies soreness , sore nipple and engorgement prevention and tx  Reviewed. Mom will have a DEBP Medela - LC delivered the DEBP for moms benefit  Pump and instructed mom on how to us it.  Also instructed mom on the use shells due to some areola edema to enhance depth at  The latch.  LC watched mom latch , depth achieved / baby fed 8 mins / multiple swallows noted/  And baby released on his own/ nipple well rounded and mom comfortable.  Mother informed of post-discharge support and given phone number to the lactation department, including services for phone call assistance; out-patient appointments; and breastfeeding support group. List of other breastfeeding resources in the community given in the handout. Encouraged mother to call for problems or concerns related to breastfeeding.  Maternal Data Has patient been taught Hand Expression?: Yes  Feeding Feeding Type: Breast Fed Length of feed: 8 min (multiple swallows still feeding at 8 mins )  LATCH Score/Interventions Latch: Grasps breast easily, tongue down, lips flanged, rhythmical sucking. Intervention(s): Adjust position;Assist with latch;Breast massage;Breast compression  Audible Swallowing: Spontaneous and intermittent  Type of Nipple: Everted at rest and after stimulation  Comfort (Breast/Nipple): Soft / non-tender     Hold (Positioning): No assistance needed to correctly position infant at breast. Intervention(s): Breastfeeding basics reviewed;Support Pillows;Position options;Skin to skin  LATCH Score: 10  Lactation Tools Discussed/Used Tools: Shells Shell Type: Inverted WIC Program: No Pump Review:  Setup, frequency, and cleaning;Milk Storage Initiated by:: MAI    Consult Status Consult Status: Complete Date: 03/28/17 Follow-up type: In-patient    Holly SprangMargaret Ann Jowell Solis 03/28/2017, 9:53 AM

## 2017-03-28 NOTE — Discharge Instructions (Signed)
As per discharge pamphlet °

## 2017-03-28 NOTE — Progress Notes (Signed)
Patient ID: Holly Solis, female   DOB: Jan 10, 1991, 26 y.o.   MRN: [id number] PPD #2 No problems Afeb, VSS, BP normal, CBGs were normal Fundus firm, NT at U-1 Continue routine postpartum care, d/c home

## 2017-04-25 IMAGING — CT CT RENAL STONE PROTOCOL
2 of 4 series · 10 of 46 positions shown, 11 images · non-contrast
Comparison: 12/05/2010

CLINICAL DATA: Right flank pain and hematuria. History of kidney
stones.

EXAM:
CT ABDOMEN AND PELVIS WITHOUT CONTRAST
TECHNIQUE: Multidetector CT imaging of the abdomen and pelvis was performed
following the standard protocol without IV contrast.

[Series 201: stone study, idose (2) · axial · 0.86mm/px · z∈[-467,-62]mm · 7 of 100 slices shown, 8 images]
[im 10/100  soft-tissue]
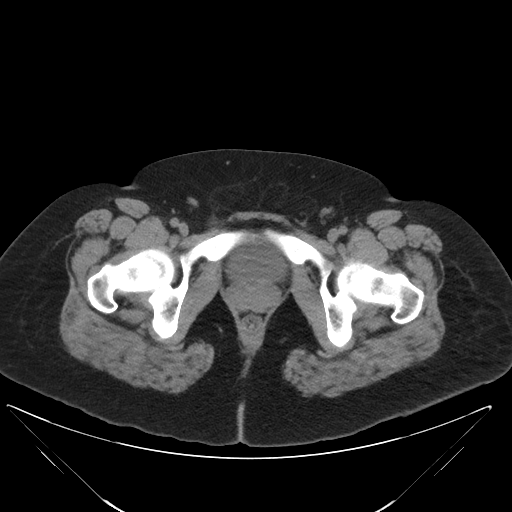
[im 10/100  bone]
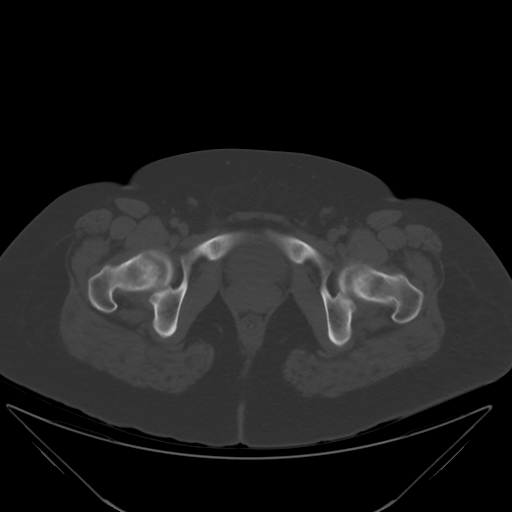
[im 23/100  soft-tissue]
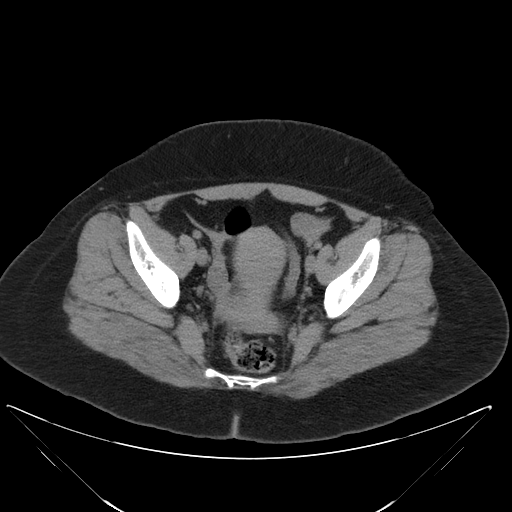
[im 37/100  soft-tissue]
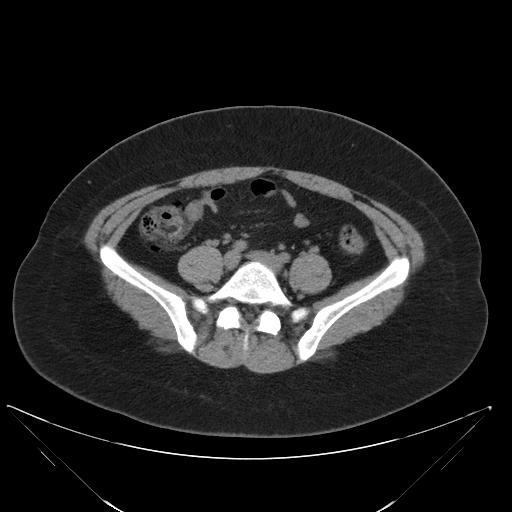
[im 50/100  soft-tissue]
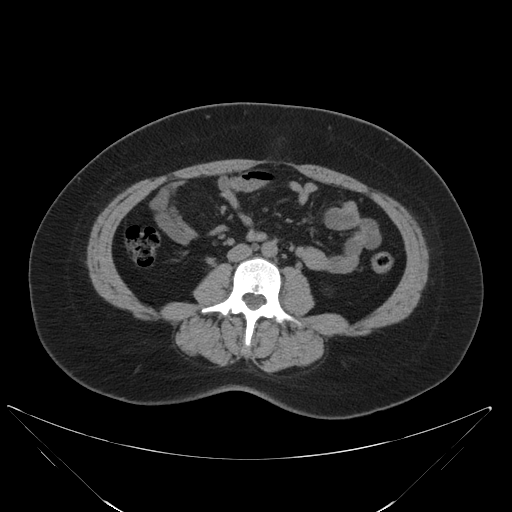
[im 64/100  soft-tissue]
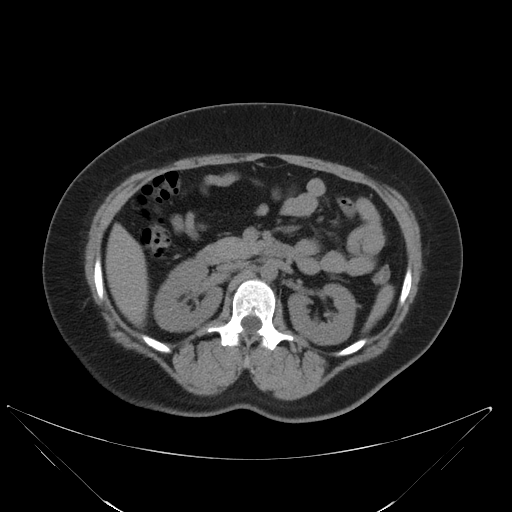
[im 77/100  soft-tissue]
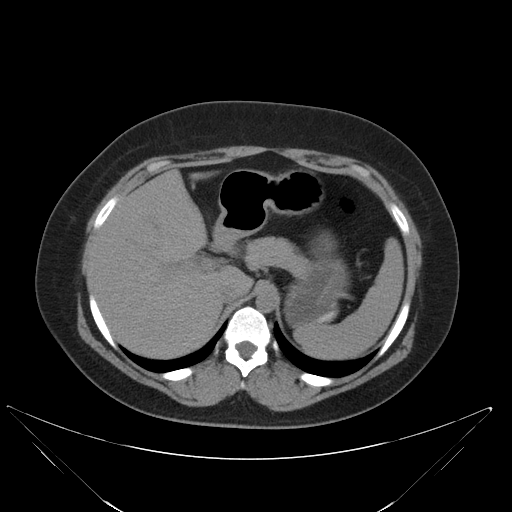
[im 91/100  soft-tissue]
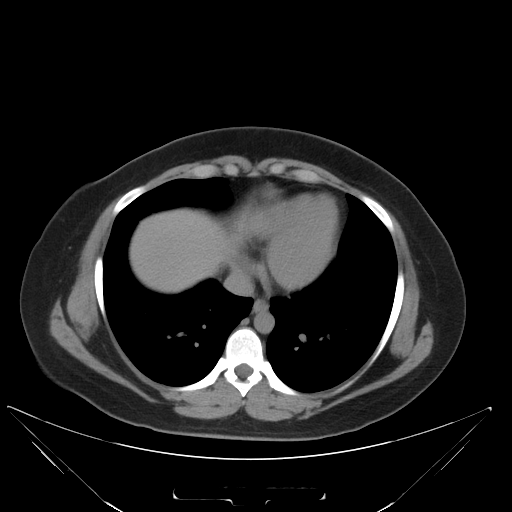

[Series 203: coronals, idose (2) · coronal · 0.45mm/px · 3 of 131 slices shown]
[im 44/131  soft-tissue]
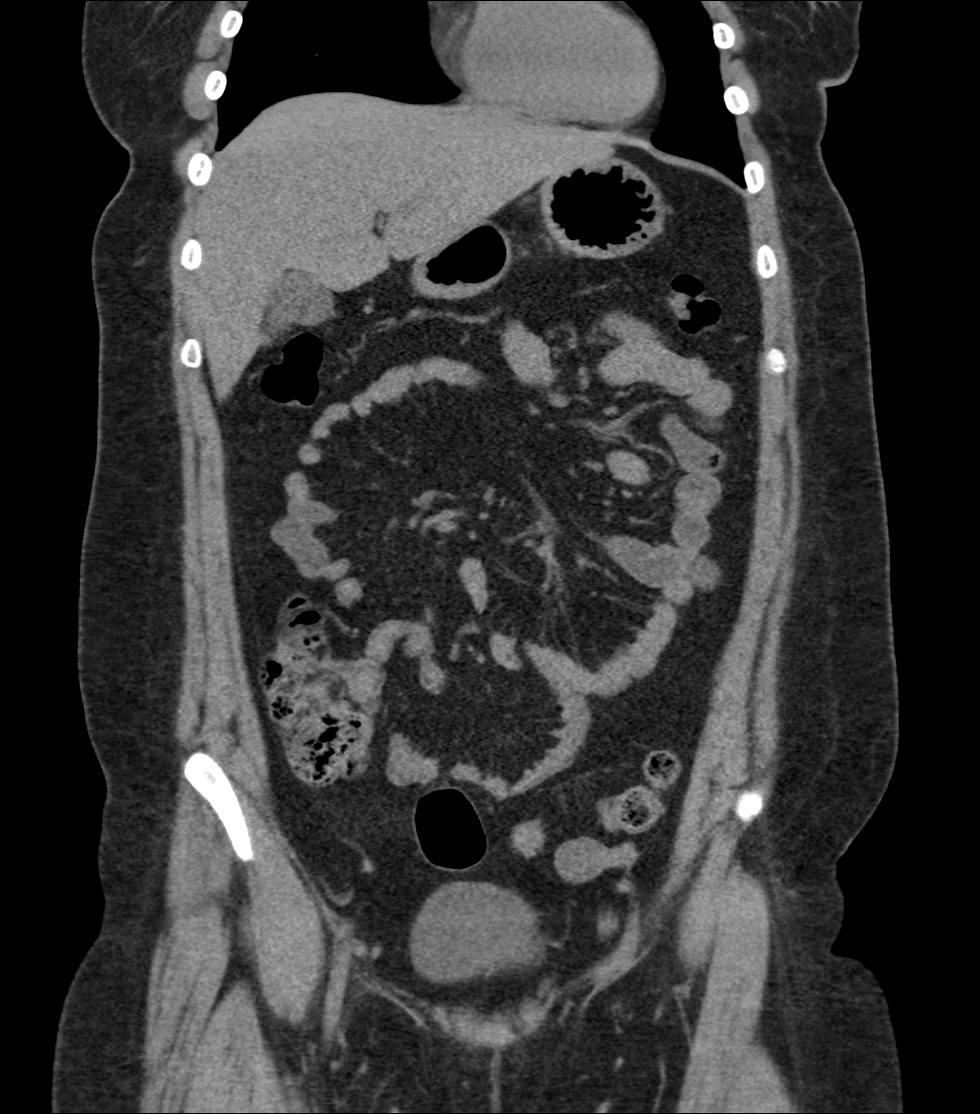
[im 58/131  soft-tissue]
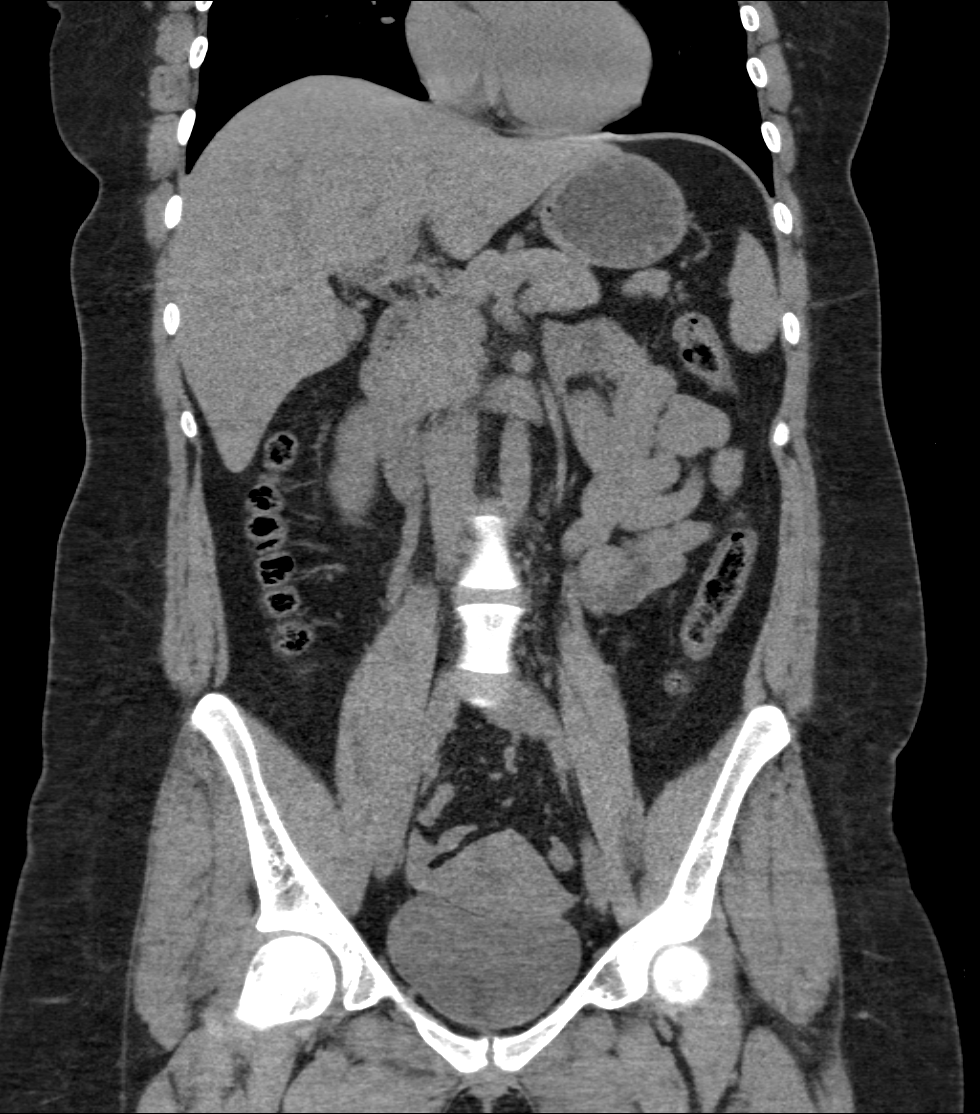
[im 73/131  soft-tissue]
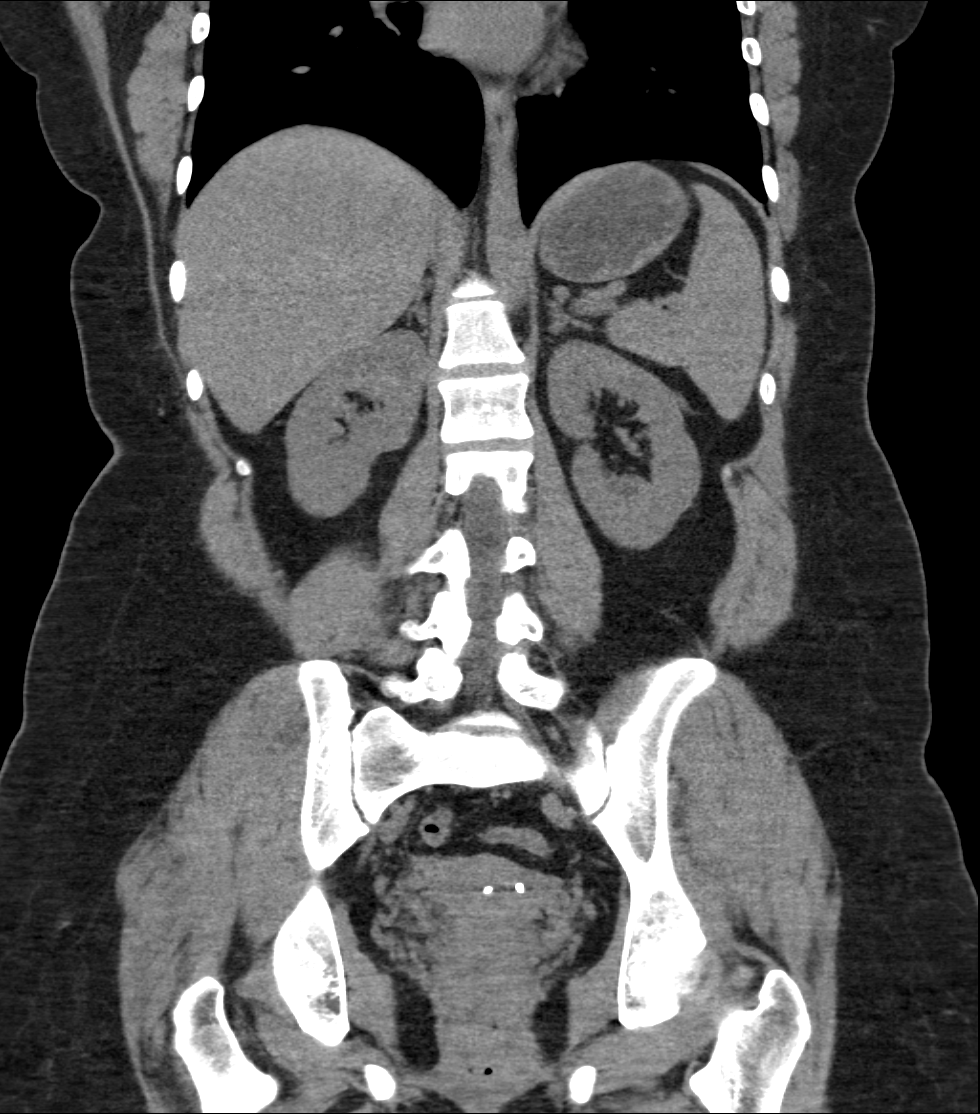

[10 of 46 positions shown; findings below may reference images not displayed]

FINDINGS: Lung bases are clear.

Kidneys are symmetrical in size and shape. No hydronephrosis or
hydroureter view. No renal, ureteral, or bladder stones identified.
No bladder wall thickening.

The unenhanced appearance of the liver, spleen, gallbladder,
pancreas, adrenal glands, abdominal aorta, inferior vena cava, and
retroperitoneal lymph nodes are unremarkable. Stomach, small bowel,
and colon are not abnormally distended. No free air or free fluid in
the abdomen. The

Pelvis: Appendix is normal. Uterus and ovaries are not enlarged.
There is an intrauterine device. Intrauterine device appears to be
low when the endometrium. Adequate positioning is not confirmed. No
free or loculated pelvic fluid collections. No pelvic mass or
lymphadenopathy. No destructive bone lesions.
IMPRESSION: No renal or ureteral stone or obstruction. Intrauterine device is
positioned low and the lower uterine segment, likely out of
position.

## 2017-05-18 DIAGNOSIS — Z30430 Encounter for insertion of intrauterine contraceptive device: Secondary | ICD-10-CM | POA: Diagnosis not present

## 2017-05-18 DIAGNOSIS — Z8632 Personal history of gestational diabetes: Secondary | ICD-10-CM | POA: Diagnosis not present

## 2017-05-21 MED FILL — metFORMIN HCL 500 MG TABS: 500 | 30 days supply | Qty: 60 | Fill #0

## 2017-08-17 DIAGNOSIS — Z30431 Encounter for routine checking of intrauterine contraceptive device: Secondary | ICD-10-CM | POA: Diagnosis not present

## 2017-08-27 DIAGNOSIS — F988 Other specified behavioral and emotional disorders with onset usually occurring in childhood and adolescence: Secondary | ICD-10-CM | POA: Diagnosis not present

## 2017-08-27 DIAGNOSIS — F4321 Adjustment disorder with depressed mood: Secondary | ICD-10-CM | POA: Diagnosis not present

## 2017-08-27 MED FILL — ADDERALL XR 30 MG CAP SA: 30 | 30 days supply | Qty: 30 | Fill #0

## 2017-09-06 ENCOUNTER — Encounter (HOSPITAL_COMMUNITY): Payer: Self-pay

## 2017-09-28 DIAGNOSIS — F4321 Adjustment disorder with depressed mood: Secondary | ICD-10-CM | POA: Diagnosis not present

## 2017-09-28 DIAGNOSIS — F988 Other specified behavioral and emotional disorders with onset usually occurring in childhood and adolescence: Secondary | ICD-10-CM | POA: Diagnosis not present

## 2017-09-28 MED FILL — ADDERALL XR 30 MG CAP SA: 30 | 30 days supply | Qty: 30 | Fill #0

## 2017-11-15 DIAGNOSIS — R102 Pelvic and perineal pain: Secondary | ICD-10-CM | POA: Diagnosis not present

## 2017-11-15 DIAGNOSIS — Z30431 Encounter for routine checking of intrauterine contraceptive device: Secondary | ICD-10-CM | POA: Diagnosis not present

## 2017-11-15 DIAGNOSIS — N939 Abnormal uterine and vaginal bleeding, unspecified: Secondary | ICD-10-CM | POA: Diagnosis not present

## 2017-12-02 MED FILL — ADDERALL XR 30 MG CAP SA: 30 | 30 days supply | Qty: 30 | Fill #0

## 2018-01-14 MED FILL — ADDERALL XR 30 MG CAP SA: 30 | 30 days supply | Qty: 30 | Fill #0

## 2018-02-21 MED FILL — ADDERALL XR 30 MG CAP SA: 30 | 30 days supply | Qty: 30 | Fill #0

## 2018-03-08 DIAGNOSIS — Z0000 Encounter for general adult medical examination without abnormal findings: Secondary | ICD-10-CM | POA: Diagnosis not present

## 2018-03-08 DIAGNOSIS — Z13220 Encounter for screening for lipoid disorders: Secondary | ICD-10-CM | POA: Diagnosis not present

## 2018-03-08 DIAGNOSIS — F988 Other specified behavioral and emotional disorders with onset usually occurring in childhood and adolescence: Secondary | ICD-10-CM | POA: Diagnosis not present

## 2018-03-08 DIAGNOSIS — Z808 Family history of malignant neoplasm of other organs or systems: Secondary | ICD-10-CM | POA: Diagnosis not present

## 2018-03-18 MED FILL — ADDERALL XR 20 MG CAP SA: 20 | 30 days supply | Qty: 30 | Fill #0

## 2018-04-25 MED FILL — BUPROPION HCL XL 150 MG TAB: 150 | 30 days supply | Qty: 30 | Fill #0 | Status: TO

## 2018-07-05 MED FILL — ADDERALL XR 20 MG CAP SA: 20 | 30 days supply | Qty: 30 | Fill #0

## 2018-07-05 MED FILL — buPROPion HCL ER (XL) 150 M: 150 | 30 days supply | Qty: 30 | Fill #0

## 2018-09-01 MED FILL — ADDERALL XR 20 MG CAP SA: 20 | 30 days supply | Qty: 30 | Fill #0

## 2018-11-10 MED FILL — ADDERALL XR 20 MG CAP SA: 20 | 30 days supply | Qty: 30 | Fill #0

## 2019-01-24 MED FILL — ADDERALL XR 20 MG CAP SA: 20 | 30 days supply | Qty: 30 | Fill #0

## 2019-03-15 DIAGNOSIS — Z79899 Other long term (current) drug therapy: Secondary | ICD-10-CM | POA: Diagnosis not present

## 2019-03-15 DIAGNOSIS — F988 Other specified behavioral and emotional disorders with onset usually occurring in childhood and adolescence: Secondary | ICD-10-CM | POA: Diagnosis not present

## 2019-03-15 MED FILL — AMPHETAMINE-DEXTROAMPHETAMI: 10 | 30 days supply | Qty: 30 | Fill #0

## 2019-03-15 MED FILL — ADDERALL XR 20 MG CAP SA: 20 | 30 days supply | Qty: 30 | Fill #0

## 2019-05-08 MED FILL — AMPHETAMINE-DEXTROAMPHETAMI: 10 | 30 days supply | Qty: 30 | Fill #0

## 2019-05-10 MED FILL — ADDERALL XR 20 MG CAP SA: 20 | 30 days supply | Qty: 30 | Fill #0

## 2019-06-08 DIAGNOSIS — N941 Unspecified dyspareunia: Secondary | ICD-10-CM | POA: Diagnosis not present

## 2019-06-08 DIAGNOSIS — N906 Unspecified hypertrophy of vulva: Secondary | ICD-10-CM | POA: Diagnosis not present

## 2019-06-28 MED FILL — ADDERALL XR 20 MG CAP SA: 20 | 30 days supply | Qty: 30 | Fill #0

## 2019-08-09 DIAGNOSIS — N941 Unspecified dyspareunia: Secondary | ICD-10-CM | POA: Diagnosis not present

## 2019-08-09 DIAGNOSIS — R102 Pelvic and perineal pain: Secondary | ICD-10-CM | POA: Diagnosis not present

## 2019-08-09 DIAGNOSIS — N906 Unspecified hypertrophy of vulva: Secondary | ICD-10-CM | POA: Diagnosis not present

## 2019-08-11 MED FILL — AMPHETAMINE-DEXTROAMPHETAMI: 10 | 30 days supply | Qty: 30 | Fill #0

## 2019-08-11 MED FILL — ADDERALL XR 20 MG CAP SA: 20 | 30 days supply | Qty: 30 | Fill #0

## 2019-10-04 MED FILL — ADDERALL XR 20 MG CAP SA: 20 | 30 days supply | Qty: 30 | Fill #0

## 2019-10-04 MED FILL — AMPHETAMINE-DEXTROAMPHETAMI: 10 | 30 days supply | Qty: 30 | Fill #0

## 2019-10-17 DIAGNOSIS — Z124 Encounter for screening for malignant neoplasm of cervix: Secondary | ICD-10-CM | POA: Diagnosis not present

## 2019-10-17 DIAGNOSIS — F4329 Adjustment disorder with other symptoms: Secondary | ICD-10-CM | POA: Diagnosis not present

## 2019-10-17 DIAGNOSIS — Z30432 Encounter for removal of intrauterine contraceptive device: Secondary | ICD-10-CM | POA: Diagnosis not present

## 2019-11-03 NOTE — L&D Delivery Note (Addendum)
Delivery Note Pt progressed rapidly to complete dilation and pushed about 10 minutes. At 6:12 PM a healthy female was delivered via Vaginal, Spontaneous (Presentation: Right Occiput Anterior).  APGAR: 9, 9; weight pending .   Placenta status: Spontaneous, Intact.  Uterus had mild atony and did not immediately respond to bimanual massage and pitocin, so given dose of TXA and bleeding improved.  Cord: 3 vessels with the following complications: None.   Anesthesia: Epidural Episiotomy: None Lacerations: None Suture Repair: none Est. Blood Loss (mL):  Mom to postpartum.  Baby to Couplet care / Skin to Skin.  Oliver Pila 09/15/2020, 6:38 PM

## 2019-11-14 MED FILL — AMPHETAMINE-DEXTROAMPHETAMI: 10 | 30 days supply | Qty: 30 | Fill #0

## 2019-11-14 MED FILL — ADDERALL XR 20 MG CAP SA: 20 | 30 days supply | Qty: 30 | Fill #0

## 2019-12-13 MED FILL — AMPHETAMINE-DEXTROAMPHETAMI: 10 | 30 days supply | Qty: 30 | Fill #0

## 2019-12-13 MED FILL — ADDERALL XR 20 MG CAP SA: 20 | 30 days supply | Qty: 30 | Fill #0

## 2020-01-08 MED FILL — ADDERALL XR 20 MG CAP SA: 20 | 30 days supply | Qty: 30 | Fill #0

## 2020-02-09 MED FILL — ADDERALL XR 20 MG CAP SA: 20 | 30 days supply | Qty: 30 | Fill #0

## 2020-02-09 MED FILL — AMPHETAMINE-DEXTROAMPHETAMI: 10 | 30 days supply | Qty: 30 | Fill #0

## 2020-02-29 DIAGNOSIS — N911 Secondary amenorrhea: Secondary | ICD-10-CM | POA: Diagnosis not present

## 2020-02-29 DIAGNOSIS — Z3201 Encounter for pregnancy test, result positive: Secondary | ICD-10-CM | POA: Diagnosis not present

## 2020-03-25 DIAGNOSIS — Z113 Encounter for screening for infections with a predominantly sexual mode of transmission: Secondary | ICD-10-CM | POA: Diagnosis not present

## 2020-03-25 DIAGNOSIS — Z3689 Encounter for other specified antenatal screening: Secondary | ICD-10-CM | POA: Diagnosis not present

## 2020-03-25 DIAGNOSIS — Z3A12 12 weeks gestation of pregnancy: Secondary | ICD-10-CM | POA: Diagnosis not present

## 2020-03-25 DIAGNOSIS — Z8632 Personal history of gestational diabetes: Secondary | ICD-10-CM | POA: Diagnosis not present

## 2020-03-25 DIAGNOSIS — Z3481 Encounter for supervision of other normal pregnancy, first trimester: Secondary | ICD-10-CM | POA: Diagnosis not present

## 2020-03-25 DIAGNOSIS — O218 Other vomiting complicating pregnancy: Secondary | ICD-10-CM | POA: Diagnosis not present

## 2020-03-25 DIAGNOSIS — O26891 Other specified pregnancy related conditions, first trimester: Secondary | ICD-10-CM | POA: Diagnosis not present

## 2020-03-25 LAB — OB RESULTS CONSOLE HIV ANTIBODY (ROUTINE TESTING): HIV: NONREACTIVE

## 2020-03-25 LAB — OB RESULTS CONSOLE HEPATITIS B SURFACE ANTIGEN: Hepatitis B Surface Ag: NEGATIVE

## 2020-03-25 LAB — HEPATITIS C ANTIBODY: HCV Ab: NEGATIVE

## 2020-03-25 LAB — OB RESULTS CONSOLE RUBELLA ANTIBODY, IGM: Rubella: IMMUNE

## 2020-03-25 MED FILL — METOCLOPRAMIDE 10 MG TABLET: 10 | 20 days supply | Qty: 60 | Fill #0

## 2020-04-22 DIAGNOSIS — Z3689 Encounter for other specified antenatal screening: Secondary | ICD-10-CM | POA: Diagnosis not present

## 2020-05-13 DIAGNOSIS — Z363 Encounter for antenatal screening for malformations: Secondary | ICD-10-CM | POA: Diagnosis not present

## 2020-05-13 DIAGNOSIS — Z3A19 19 weeks gestation of pregnancy: Secondary | ICD-10-CM | POA: Diagnosis not present

## 2020-07-09 DIAGNOSIS — Z8632 Personal history of gestational diabetes: Secondary | ICD-10-CM | POA: Diagnosis not present

## 2020-07-09 DIAGNOSIS — Z3689 Encounter for other specified antenatal screening: Secondary | ICD-10-CM | POA: Diagnosis not present

## 2020-07-09 DIAGNOSIS — Z3A27 27 weeks gestation of pregnancy: Secondary | ICD-10-CM | POA: Diagnosis not present

## 2020-07-09 DIAGNOSIS — Z23 Encounter for immunization: Secondary | ICD-10-CM | POA: Diagnosis not present

## 2020-07-09 LAB — OB RESULTS CONSOLE RPR: RPR: NONREACTIVE

## 2020-07-09 LAB — OB RESULTS CONSOLE HIV ANTIBODY (ROUTINE TESTING): HIV: NONREACTIVE

## 2020-07-15 DIAGNOSIS — O99810 Abnormal glucose complicating pregnancy: Secondary | ICD-10-CM | POA: Diagnosis not present

## 2020-07-23 DIAGNOSIS — Z3483 Encounter for supervision of other normal pregnancy, third trimester: Secondary | ICD-10-CM | POA: Diagnosis not present

## 2020-07-23 DIAGNOSIS — Z3A29 29 weeks gestation of pregnancy: Secondary | ICD-10-CM | POA: Diagnosis not present

## 2020-08-10 ENCOUNTER — Other Ambulatory Visit: Payer: Self-pay

## 2020-08-10 ENCOUNTER — Encounter (HOSPITAL_COMMUNITY): Payer: Self-pay | Admitting: Obstetrics and Gynecology

## 2020-08-10 ENCOUNTER — Inpatient Hospital Stay (HOSPITAL_COMMUNITY)
Admission: AD | Admit: 2020-08-10 | Discharge: 2020-08-10 | Disposition: A | Discharge status: Home / Self Care | Payer: 59 | Source: Ambulatory Visit | Attending: Obstetrics and Gynecology | Admitting: Obstetrics and Gynecology

## 2020-08-10 DIAGNOSIS — R030 Elevated blood-pressure reading, without diagnosis of hypertension: Secondary | ICD-10-CM

## 2020-08-10 DIAGNOSIS — Z3A31 31 weeks gestation of pregnancy: Secondary | ICD-10-CM | POA: Diagnosis not present

## 2020-08-10 DIAGNOSIS — E039 Hypothyroidism, unspecified: Secondary | ICD-10-CM | POA: Diagnosis not present

## 2020-08-10 DIAGNOSIS — O99891 Other specified diseases and conditions complicating pregnancy: Secondary | ICD-10-CM | POA: Diagnosis not present

## 2020-08-10 DIAGNOSIS — Z8349 Family history of other endocrine, nutritional and metabolic diseases: Secondary | ICD-10-CM | POA: Insufficient documentation

## 2020-08-10 DIAGNOSIS — Z87891 Personal history of nicotine dependence: Secondary | ICD-10-CM | POA: Diagnosis not present

## 2020-08-10 DIAGNOSIS — O133 Gestational [pregnancy-induced] hypertension without significant proteinuria, third trimester: Secondary | ICD-10-CM | POA: Diagnosis not present

## 2020-08-10 DIAGNOSIS — O26893 Other specified pregnancy related conditions, third trimester: Secondary | ICD-10-CM | POA: Diagnosis not present

## 2020-08-10 DIAGNOSIS — H539 Unspecified visual disturbance: Secondary | ICD-10-CM

## 2020-08-10 DIAGNOSIS — Z3A32 32 weeks gestation of pregnancy: Secondary | ICD-10-CM | POA: Diagnosis not present

## 2020-08-10 LAB — COMPREHENSIVE METABOLIC PANEL
ALT: 15 U/L (ref 0–44)
AST: 17 U/L (ref 15–41)
Albumin: 3 g/dL — ABNORMAL LOW (ref 3.5–5.0)
Alkaline Phosphatase: 81 U/L (ref 38–126)
Anion gap: 11 (ref 5–15)
BUN: 5 mg/dL — ABNORMAL LOW (ref 6–20)
CO2: 20 mmol/L — ABNORMAL LOW (ref 22–32)
Calcium: 9.2 mg/dL (ref 8.9–10.3)
Chloride: 104 mmol/L (ref 98–111)
Creatinine, Ser: 0.43 mg/dL — ABNORMAL LOW (ref 0.44–1.00)
GFR, Estimated: >60 mL/min (ref >60)
Glucose, Bld: 92 mg/dL (ref 70–99)
Potassium: 3.7 mmol/L (ref 3.5–5.1)
Sodium: 135 mmol/L (ref 135–145)
Total Bilirubin: 0.3 mg/dL (ref 0.3–1.2)
Total Protein: 6 g/dL — ABNORMAL LOW (ref 6.5–8.1)

## 2020-08-10 LAB — URINALYSIS, ROUTINE W REFLEX MICROSCOPIC
Bilirubin Urine: NEGATIVE
Glucose, UA: NEGATIVE mg/dL
Hgb urine dipstick: NEGATIVE
Ketones, ur: NEGATIVE mg/dL
Leukocytes,Ua: NEGATIVE
Nitrite: NEGATIVE
Protein, ur: NEGATIVE mg/dL
Specific Gravity, Urine: 1.003 — ABNORMAL LOW (ref 1.005–1.030)
pH: 7 (ref 5.0–8.0)

## 2020-08-10 LAB — CBC
HCT: 35.6 % — ABNORMAL LOW (ref 36.0–46.0)
Hemoglobin: 12.5 g/dL (ref 12.0–15.0)
MCH: 30.9 pg (ref 26.0–34.0)
MCHC: 35.1 g/dL (ref 30.0–36.0)
MCV: 88.1 fL (ref 80.0–100.0)
Platelets: 174 10*3/uL (ref 150–400)
RBC: 4.04 MIL/uL (ref 3.87–5.11)
RDW: 13 % (ref 11.5–15.5)
WBC: 11.1 10*3/uL — ABNORMAL HIGH (ref 4.0–10.5)
nRBC: 0 % (ref 0.0–0.2)

## 2020-08-10 LAB — PROTEIN / CREATININE RATIO, URINE
Creatinine, Urine: 20.69 mg/dL
Total Protein, Urine: <6 mg/dL

## 2020-08-10 MED ORDER — LABETALOL HCL 100 MG PO TABS: 200.0000 mg | ORAL_TABLET | Freq: Two times a day (BID) | ORAL | 2 refills | Status: DC

## 2020-08-10 NOTE — MAU Note (Signed)
Pt reports to mau with c/o elevated bp after eating lunch.  Pt reports feeling dizzy and seeing spots.  BP were : 139/90, 134/95, 131/88.  Denies ctx or LOF.  Reports her cbg after lunch was 100.

## 2020-08-10 NOTE — Discharge Instructions (Signed)

## 2020-08-10 NOTE — MAU Provider Note (Signed)
History     CSN: [id number]  Arrival date and time: 08/10/20 1542   First Provider Initiated Contact with Patient 08/10/20 1746      Chief Complaint  Patient presents with   Hypertension   Blurred Vision   HPI  [name] is a 29 y.o. female [phone] here in MAU with complaints of elevated BP and changes in her vision.  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 Co-workers checked her BP and it read 139/90. She reports seeing floaters throughout today. At one point she did have blurred vision; however none now.  Hx of gestational HTN with her last pregnancy. No HA  OB History    Gravida  4   Para  2   Term  2   Preterm      AB  1   Living  2     SAB  1   TAB      Ectopic      Multiple      Live Births  2           Past Medical History:  Diagnosis Date   ADHD (attention deficit hyperactivity disorder)    Diabetes mellitus without complication (HCC)    GDM   Gestational diabetes    History of kidney stones    Hypothyroidism    last check normal    Past Surgical History:  Procedure Laterality Date   LITHOTRIPSY     TYMPANOSTOMY TUBE PLACEMENT      Family History  Problem Relation Age of Onset   Diabetes Mother    Hyperlipidemia Mother    Asthma Father    COPD Father    Diabetes Father    Hyperlipidemia Father    Asthma Sister    Cancer Maternal Grandmother    Heart disease Maternal Grandmother    Hypertension Maternal Grandmother    Kidney disease Maternal Grandmother     Social History   Tobacco Use   Smoking status: Former Smoker    Quit date: 01/22/2013    Years since quitting: 7.5   Smokeless tobacco: Never Used  Substance Use Topics   Alcohol use: No   Drug use: No    Allergies: No Known Allergies  Medications Prior to Admission  Medication Sig Dispense Refill Last Dose   acetaminophen (TYLENOL) 500 MG tablet Take 500 mg by mouth every 6  (six) hours as needed.      ibuprofen (ADVIL,MOTRIN) 600 MG tablet Take 1 tablet (600 mg total) by mouth every 6 (six) hours. 30 tablet 0    Prenatal Vit-Fe Fumarate-FA (PRENATAL MULTIVITAMIN) TABS tablet Take 1 tablet by mouth daily at 12 noon.      Results for orders placed or performed during the hospital encounter of 08/10/20 (from the past 48 hour(s))  Urinalysis, Routine w reflex microscopic Urine, Clean Catch     Status: Abnormal   Collection Time: 08/10/20  4:11 PM  Result Value Ref Range   Color, Urine STRAW (A) YELLOW   APPearance HAZY (A) CLEAR   Specific Gravity, Urine 1.003 (L) 1.005 - 1.030   pH 7.0 5.0 - 8.0   Glucose, UA NEGATIVE NEGATIVE mg/dL   Hgb urine dipstick NEGATIVE NEGATIVE   Bilirubin Urine NEGATIVE NEGATIVE   Ketones, ur NEGATIVE NEGATIVE mg/dL   Protein, ur NEGATIVE NEGATIVE mg/dL   Nitrite NEGATIVE NEGATIVE   Leukocytes,Ua NEGATIVE NEGATIVE    Comment: Performed at [facility]  Lab, [address], [place], [place] [address]  Protein / creatinine ratio, urine     Status: None   Collection Time: 08/10/20  4:20 PM  Result Value Ref Range   Creatinine, Urine 20.69 mg/dL   Total Protein, Urine <6 mg/dL    Comment: NO NORMAL RANGE ESTABLISHED FOR THIS TEST   Protein Creatinine Ratio        0.00 - 0.15 mg/mg[Cre]    Comment: RESULT BELOW REPORTABLE RANGE, UNABLE TO CALCULATE. Performed at [facility], [address] [place], [place] [address]   CBC     Status: Abnormal   Collection Time: 08/10/20  4:27 PM  Result Value Ref Range   WBC 11.1 (H) 4.0 - 10.5 K/uL   RBC 4.04 3.87 - 5.11 MIL/uL   Hemoglobin 12.5 12.0 - 15.0 g/dL   HCT 71.2 (L) 36 - 46 %   MCV 88.1 80.0 - 100.0 fL   MCH 30.9 26.0 - 34.0 pg   MCHC 35.1 30.0 - 36.0 g/dL   RDW 45.8 09.9 - 83.3 %   Platelets 174 150 - 400 K/uL   nRBC 0.0 0.0 - 0.2 %    Comment: Performed at [facility], [address] [place], [place] [address]  Comprehensive metabolic panel     Status:  Abnormal   Collection Time: 08/10/20  4:27 PM  Result Value Ref Range   Sodium 135 135 - 145 mmol/L   Potassium 3.7 3.5 - 5.1 mmol/L   Chloride 104 98 - 111 mmol/L   CO2 20 (L) 22 - 32 mmol/L   Glucose, Bld 92 70 - 99 mg/dL    Comment: Glucose reference range applies only to samples taken after fasting for at least 8 hours.   BUN 5 (L) 6 - 20 mg/dL   Creatinine, Ser 3.97 (L) 0.44 - 1.00 mg/dL   Calcium 9.2 8.9 - 67.3 mg/dL   Total Protein 6.0 (L) 6.5 - 8.1 g/dL   Albumin 3.0 (L) 3.5 - 5.0 g/dL   AST 17 15 - 41 U/L   ALT 15 0 - 44 U/L   Alkaline Phosphatase 81 38 - 126 U/L   Total Bilirubin 0.3 0.3 - 1.2 mg/dL   GFR, Estimated >41 >93 mL/min   Anion gap 11 5 - 15    Comment: Performed at [facility] Lab, [address] [place], [place] [address]    Review of Systems  Constitutional: Negative for fever.  Eyes: Positive for visual disturbance (Blurred vision & floaters in vision ). Negative for photophobia.  Gastrointestinal: Negative for abdominal pain.  Neurological: Negative for headaches.   Physical Exam   Blood pressure (!) 144/94, pulse 94, temperature 98.2 F (36.8 C), resp. rate 16, SpO2 97 %, unknown if currently breastfeeding.  Patient Vitals for the past 24 hrs:  BP Temp Pulse Resp SpO2  08/10/20 1800 125/75 -- 84 -- 99 %  08/10/20 1745 (!) 145/122 -- 74 -- 99 %  08/10/20 1730 (!) 145/91 -- 96 -- 98 %  08/10/20 1715 (!) 147/88 -- (!) 105 -- 98 %  08/10/20 1700 (!) 163/75 -- 98 -- --  08/10/20 1645 (!) 144/94 -- 94 -- 97 %  08/10/20 1632 (!) 154/81 -- 85 -- --  08/10/20 1620 -- -- -- -- 96 %  08/10/20 1616 (!) 156/100 -- 100 -- --  08/10/20 1615 -- -- -- -- 97 %  08/10/20 1611 139/78 -- (!) 103 -- --  08/10/20  1609 (!) 151/95 98.2 F (36.8 C) 86 16 --     Physical Exam Constitutional:      General: She is not in acute distress.    Appearance: Normal appearance. She is not ill-appearing, toxic-appearing or diaphoretic.  HENT:     Head: Normocephalic.   Musculoskeletal:        General: Normal range of motion.  Skin:    General: Skin is warm.  Neurological:     General: No focal deficit present.     Mental Status: She is alert and oriented to person, place, and time.     Deep Tendon Reflexes: Reflexes normal.    Fetal Tracing: Baseline: 120 bpm Variability: Moderate  Accelerations: 15x15 Decelerations: None Toco: None   MAU Course  Procedures  MDM  -1800: Discussed All BP readings, labs, and HPI with Dr. Antony Blackbird. Discussed admitting patient for 24 for BP observation d/t 2 documented BP readings that are severe range 163/75 & 145/122; Dr. Jackelyn Knife voices concern of accuracy of BP 145/122 and recommends the patient start labetalol 200 mg BID with BP check in the office next week -Discussed patient scenario & plan with Dr. Jolayne Panther who recommends close BP check in MAU tomorrow am. Dr. Jackelyn Knife ok with plan of care.  -Discussed plan with patient. BP now normal 125/75. She is agreeable to return tomorrow to MAU around 0900 with BP check. Discussed pre E precautions in detail.   Assessment and Plan   A:  1. Elevated BP without diagnosis of hypertension   2. [redacted] weeks gestation of pregnancy   3. Visual changes     P:  Discharge home with strict return precautions Rx: Labetalol Strict pre E precautions Return tomorrow morning for BP check    Ena Demary 08/10/2020, 5:48 PM

## 2020-08-11 ENCOUNTER — Encounter (HOSPITAL_COMMUNITY): Payer: Self-pay | Admitting: Obstetrics and Gynecology

## 2020-08-11 ENCOUNTER — Inpatient Hospital Stay (HOSPITAL_COMMUNITY)
Admission: AD | Admit: 2020-08-11 | Discharge: 2020-08-11 | Disposition: A | Discharge status: Home / Self Care | Payer: 59 | Attending: Obstetrics and Gynecology | Admitting: Obstetrics and Gynecology

## 2020-08-11 DIAGNOSIS — O24913 Unspecified diabetes mellitus in pregnancy, third trimester: Secondary | ICD-10-CM | POA: Diagnosis not present

## 2020-08-11 DIAGNOSIS — O133 Gestational [pregnancy-induced] hypertension without significant proteinuria, third trimester: Secondary | ICD-10-CM | POA: Insufficient documentation

## 2020-08-11 DIAGNOSIS — Z3A32 32 weeks gestation of pregnancy: Secondary | ICD-10-CM | POA: Insufficient documentation

## 2020-08-11 DIAGNOSIS — Z87891 Personal history of nicotine dependence: Secondary | ICD-10-CM | POA: Diagnosis not present

## 2020-08-11 LAB — CBC
HCT: 36.5 % (ref 36.0–46.0)
Hemoglobin: 12.5 g/dL (ref 12.0–15.0)
MCH: 30.3 pg (ref 26.0–34.0)
MCHC: 34.2 g/dL (ref 30.0–36.0)
MCV: 88.6 fL (ref 80.0–100.0)
Platelets: 171 10*3/uL (ref 150–400)
RBC: 4.12 MIL/uL (ref 3.87–5.11)
RDW: 13.2 % (ref 11.5–15.5)
WBC: 8.9 10*3/uL (ref 4.0–10.5)
nRBC: 0 % (ref 0.0–0.2)

## 2020-08-11 LAB — COMPREHENSIVE METABOLIC PANEL
ALT: 15 U/L (ref 0–44)
AST: 18 U/L (ref 15–41)
Albumin: 2.9 g/dL — ABNORMAL LOW (ref 3.5–5.0)
Alkaline Phosphatase: 83 U/L (ref 38–126)
Anion gap: 11 (ref 5–15)
BUN: 6 mg/dL (ref 6–20)
CO2: 21 mmol/L — ABNORMAL LOW (ref 22–32)
Calcium: 8.9 mg/dL (ref 8.9–10.3)
Chloride: 105 mmol/L (ref 98–111)
Creatinine, Ser: 0.55 mg/dL (ref 0.44–1.00)
GFR, Estimated: >60 mL/min (ref >60)
Glucose, Bld: 121 mg/dL — ABNORMAL HIGH (ref 70–99)
Potassium: 3.7 mmol/L (ref 3.5–5.1)
Sodium: 137 mmol/L (ref 135–145)
Total Bilirubin: 0.3 mg/dL (ref 0.3–1.2)
Total Protein: 6 g/dL — ABNORMAL LOW (ref 6.5–8.1)

## 2020-08-11 LAB — PROTEIN / CREATININE RATIO, URINE
Creatinine, Urine: 133.39 mg/dL
Protein Creatinine Ratio: 0.08 mg/mg{Cre} (ref 0.00–0.15)
Total Protein, Urine: 11 mg/dL

## 2020-08-11 LAB — URINALYSIS, ROUTINE W REFLEX MICROSCOPIC
Bilirubin Urine: NEGATIVE
Glucose, UA: NEGATIVE mg/dL
Hgb urine dipstick: NEGATIVE
Ketones, ur: 5 mg/dL — AB
Leukocytes,Ua: NEGATIVE
Nitrite: NEGATIVE
Protein, ur: NEGATIVE mg/dL
Specific Gravity, Urine: 1.016 (ref 1.005–1.030)
pH: 7 (ref 5.0–8.0)

## 2020-08-11 NOTE — Discharge Instructions (Signed)

## 2020-08-11 NOTE — MAU Provider Note (Addendum)
Patient Holly Solis is a 29 y.o. [id number]  [redacted]w[redacted]d here for BP check. Patient was seen yesterday in MAU; had elevated BPs yesterday along with blurry vision and floating spots yesterday. After consult with private MD and faculty MD, patient was discharged home with plan to start labetalol 200 mg BID and plan for follow up on Sunday.    Today she reports floating spots (5 spots), "it feels like "black spots" when you look at the sun.". She reports HA that was 6/10, Tylenol brought it down to 2/10 and now it is completely gone.   She has a history of GHTN and IOL at 37 weeks due to blood pressure issues in the past.   Patient failed early GTT and then refused follow up 3 hour at 28 weeks. Reports she has been checking her sugars, and that her fastings have been 95-118; she reports that her 2 hours has been "normal".   History     CSN: [id number]  Arrival date and time: 08/11/20 0944   First Provider Initiated Contact with Patient 08/11/20 1040      No chief complaint on file.  Hypertension This is a chronic problem. The current episode started today. Pertinent negatives include no blurred vision. (Floating black spots )  She reports that she does not have any elevated BP outside of pregnancy but that her BP was up while she was at work yesterday (She is a [employer]) and that she had a small HA this morning.  She was started on BP yesterday but has not taken her dose today.   OB History    Gravida  4   Para  2   Term  2   Preterm      AB  1   Living  2     SAB  1   TAB      Ectopic      Multiple      Live Births  2           Past Medical History:  Diagnosis Date  . ADHD (attention deficit hyperactivity disorder)   . Diabetes mellitus without complication (HCC)    GDM  . Gestational diabetes   . History of kidney stones   . Hypothyroidism    last check normal    Past Surgical History:  Procedure Laterality Date  . LITHOTRIPSY    . TYMPANOSTOMY  TUBE PLACEMENT      Family History  Problem Relation Age of Onset  . Diabetes Mother   . Hyperlipidemia Mother   . Asthma Father   . COPD Father   . Diabetes Father   . Hyperlipidemia Father   . Asthma Sister   . Cancer Maternal Grandmother   . Heart disease Maternal Grandmother   . Hypertension Maternal Grandmother   . Kidney disease Maternal Grandmother     Social History   Tobacco Use  . Smoking status: Former Smoker    Quit date: 01/22/2013    Years since quitting: 7.5  . Smokeless tobacco: Never Used  Substance Use Topics  . Alcohol use: No  . Drug use: No    Allergies: No Known Allergies  Medications Prior to Admission  Medication Sig Dispense Refill Last Dose  . acetaminophen (TYLENOL) 500 MG tablet Take 500 mg by mouth every 6 (six) hours as needed.     . labetalol (NORMODYNE) 100 MG tablet Take 2 tablets (200 mg total) by mouth 2 (two) times daily. 30 tablet 2   .  Prenatal Vit-Fe Fumarate-FA (PRENATAL MULTIVITAMIN) TABS tablet Take 1 tablet by mouth daily at 12 noon.       Review of Systems  Constitutional: Negative.   HENT: Negative.   Eyes: Positive for visual disturbance. Negative for blurred vision.  Gastrointestinal: Negative.   Genitourinary: Negative.   Neurological: Negative.   Hematological: Negative.   Psychiatric/Behavioral: Negative.    Physical Exam   Blood pressure 122/70, pulse 93, temperature 98.7 F (37.1 C), temperature source Oral, resp. rate 18, SpO2 98 %, unknown if currently breastfeeding.  Physical Exam Constitutional:      Appearance: Normal appearance.  HENT:     Head: Normocephalic.  Cardiovascular:     Rate and Rhythm: Normal rate.  Pulmonary:     Effort: Pulmonary effort is normal.  Musculoskeletal:        General: Normal range of motion.  Skin:    General: Skin is warm.  Neurological:     General: No focal deficit present.     Mental Status: She is alert and oriented to person, place, and time.     MAU  Course  Procedures  MDM -will draw baseline labs> normal CBC, CMP, PCR -HA is resolved upon entry to MAU. Patient took a nap and she reports that she does not see floating spots right now.   Patient Vitals for the past 24 hrs:  BP Temp Temp src Pulse Resp SpO2  08/11/20 1245 -- -- -- -- -- 98 %  08/11/20 1230 122/70 -- -- 93 -- 100 %  08/11/20 1215 119/72 -- -- 93 -- 99 %  08/11/20 1200 99/68 -- -- 92 -- 97 %  08/11/20 1145 100/66 -- -- 99 -- 97 %  08/11/20 1130 122/85 -- -- 91 -- 97 %  08/11/20 1115 106/90 -- -- (!) 105 -- --  08/11/20 1100 (!) 139/94 -- -- -- -- --  08/11/20 1031 138/84 -- -- (!) 114 -- --  08/11/20 1015 (!) 148/81 -- -- (!) 126 -- --  08/11/20 1010 -- -- -- -- -- 99 %  08/11/20 1007 (!) 143/80 -- -- (!) 130 -- --  08/11/20 0954 (!) 139/103 98.7 F (37.1 C) Oral (!) 132 18 99 %   1345: Patient continues to deny any signs/symptoms of pre-e. She has no HA, blurry vision, floating spots, overall swellling. BPs are now normal and pre-e labs were normal.   NST: 135 bpm, mod var, present acel, no decels, no contractions.  Assessment and Plan   1. Gestational hypertension, third trimester    2. Patient stable for discharge with plan to follow up with Dr. Mindi Slicker tomorrow. Dr. Macon Large agrees with plan of care, and Dr. Jackelyn Knife updated and agrees with plan of care.   3. Strict pre-e precautions given, patient verbalized understanding.   4. Ask MD tomorrow about BP management and also discuss Blood sugars. Discussed that patient's fasting BS is elevated and will need to be treated.   5. All questions answered; patient stable for discharge.  Charlesetta Garibaldi Kingsly Kloepfer 08/11/2020, 1:46 PM

## 2020-08-11 NOTE — MAU Note (Signed)
Holly Solis is a 29 y.o. at [redacted]w[redacted]d here in MAU reporting: here for BP check. Had a headache earlier but took some tylenol and that helped some but still feels the headache some. Still having floaters from yesterday. No RUQ pain. States chest feels heavy but not painful. +FM  Onset of complaint: ongoing  Pain score: 2/10  Vitals:   08/11/20 0954  BP: (!) 139/103  Pulse: (!) 132  Resp: 18  Temp: 98.7 F (37.1 C)  SpO2: 99%     FHT: +FM  Lab orders placed from triage: UA

## 2020-08-15 DIAGNOSIS — Z3A32 32 weeks gestation of pregnancy: Secondary | ICD-10-CM | POA: Diagnosis not present

## 2020-08-15 DIAGNOSIS — O24415 Gestational diabetes mellitus in pregnancy, controlled by oral hypoglycemic drugs: Secondary | ICD-10-CM | POA: Diagnosis not present

## 2020-08-21 DIAGNOSIS — O24414 Gestational diabetes mellitus in pregnancy, insulin controlled: Secondary | ICD-10-CM | POA: Diagnosis not present

## 2020-08-21 DIAGNOSIS — Z3A33 33 weeks gestation of pregnancy: Secondary | ICD-10-CM | POA: Diagnosis not present

## 2020-08-30 DIAGNOSIS — O24415 Gestational diabetes mellitus in pregnancy, controlled by oral hypoglycemic drugs: Secondary | ICD-10-CM | POA: Diagnosis not present

## 2020-08-30 DIAGNOSIS — Z3A34 34 weeks gestation of pregnancy: Secondary | ICD-10-CM | POA: Diagnosis not present

## 2020-09-04 ENCOUNTER — Encounter (HOSPITAL_COMMUNITY): Payer: Self-pay | Admitting: Obstetrics and Gynecology

## 2020-09-04 ENCOUNTER — Other Ambulatory Visit: Payer: Self-pay

## 2020-09-04 ENCOUNTER — Inpatient Hospital Stay (HOSPITAL_COMMUNITY)
Admission: AD | Admit: 2020-09-04 | Discharge: 2020-09-04 | Disposition: A | Discharge status: Home / Self Care | Payer: 59 | Attending: Obstetrics and Gynecology | Admitting: Obstetrics and Gynecology

## 2020-09-04 DIAGNOSIS — Z79899 Other long term (current) drug therapy: Secondary | ICD-10-CM | POA: Insufficient documentation

## 2020-09-04 DIAGNOSIS — Z87891 Personal history of nicotine dependence: Secondary | ICD-10-CM | POA: Diagnosis not present

## 2020-09-04 DIAGNOSIS — Z3A35 35 weeks gestation of pregnancy: Secondary | ICD-10-CM | POA: Diagnosis not present

## 2020-09-04 DIAGNOSIS — O133 Gestational [pregnancy-induced] hypertension without significant proteinuria, third trimester: Secondary | ICD-10-CM | POA: Insufficient documentation

## 2020-09-04 LAB — URINALYSIS, ROUTINE W REFLEX MICROSCOPIC
Bilirubin Urine: NEGATIVE
Glucose, UA: NEGATIVE mg/dL
Hgb urine dipstick: NEGATIVE
Ketones, ur: NEGATIVE mg/dL
Leukocytes,Ua: NEGATIVE
Nitrite: NEGATIVE
Protein, ur: NEGATIVE mg/dL
Specific Gravity, Urine: 1.011 (ref 1.005–1.030)
pH: 7 (ref 5.0–8.0)

## 2020-09-04 LAB — COMPREHENSIVE METABOLIC PANEL
ALT: 15 U/L (ref 0–44)
AST: 18 U/L (ref 15–41)
Albumin: 2.7 g/dL — ABNORMAL LOW (ref 3.5–5.0)
Alkaline Phosphatase: 92 U/L (ref 38–126)
Anion gap: 8 (ref 5–15)
BUN: 8 mg/dL (ref 6–20)
CO2: 22 mmol/L (ref 22–32)
Calcium: 9.1 mg/dL (ref 8.9–10.3)
Chloride: 104 mmol/L (ref 98–111)
Creatinine, Ser: 0.62 mg/dL (ref 0.44–1.00)
GFR, Estimated: >60 mL/min (ref >60)
Glucose, Bld: 118 mg/dL — ABNORMAL HIGH (ref 70–99)
Potassium: 4.1 mmol/L (ref 3.5–5.1)
Sodium: 134 mmol/L — ABNORMAL LOW (ref 135–145)
Total Bilirubin: 0.5 mg/dL (ref 0.3–1.2)
Total Protein: 5.7 g/dL — ABNORMAL LOW (ref 6.5–8.1)

## 2020-09-04 LAB — CBC
HCT: 34.9 % — ABNORMAL LOW (ref 36.0–46.0)
Hemoglobin: 12.2 g/dL (ref 12.0–15.0)
MCH: 30.9 pg (ref 26.0–34.0)
MCHC: 35 g/dL (ref 30.0–36.0)
MCV: 88.4 fL (ref 80.0–100.0)
Platelets: 151 10*3/uL (ref 150–400)
RBC: 3.95 MIL/uL (ref 3.87–5.11)
RDW: 13.2 % (ref 11.5–15.5)
WBC: 9.2 10*3/uL (ref 4.0–10.5)
nRBC: 0 % (ref 0.0–0.2)

## 2020-09-04 LAB — PROTEIN / CREATININE RATIO, URINE
Creatinine, Urine: 73.69 mg/dL
Total Protein, Urine: <6 mg/dL

## 2020-09-04 MED ORDER — BETAMETHASONE SOD PHOS & ACET 6 (3-3) MG/ML IJ SUSP
12.0000 mg | Freq: Once | INTRAMUSCULAR | Status: AC
Start: 2020-09-04 — End: 2020-09-04
  Administered 2020-09-04: 12 mg via INTRAMUSCULAR
  Filled 2020-09-04: qty 5

## 2020-09-04 NOTE — Discharge Instructions (Signed)
Hypertension During Pregnancy °High blood pressure (hypertension) is when the force of blood pumping through the arteries is too strong. Arteries are blood vessels that carry blood from the heart throughout the body. Hypertension during pregnancy can be mild or severe. Severe hypertension during pregnancy (preeclampsia) is a medical emergency that requires prompt evaluation and treatment. °Different types of hypertension can happen during pregnancy. These include: °· Chronic hypertension. This happens when you had high blood pressure before you became pregnant, and it continues during the pregnancy. Hypertension that develops before you are [redacted] weeks pregnant and continues during the pregnancy is also called chronic hypertension. If you have chronic hypertension, it will not go away after you have your baby. You will need follow-up visits with your health care provider after you have your baby. Your doctor may want you to keep taking medicine for your blood pressure. °· Gestational hypertension. This is hypertension that develops after the 20th week of pregnancy. Gestational hypertension usually goes away after you have your baby, but your health care provider will need to monitor your blood pressure to make sure that it is getting better. °· Preeclampsia. This is severe hypertension during pregnancy. This can cause serious complications for you and your baby and can also cause complications for you after the delivery of your baby. °· Postpartum preeclampsia. You may develop severe hypertension after giving birth. This usually occurs within 48 hours after childbirth but may occur up to 6 weeks after giving birth. This is rare. °How does this affect me? °Women who have hypertension during pregnancy have a greater chance of developing hypertension later in life or during future pregnancies. In some cases, hypertension during pregnancy can cause serious complications, such as: °· Stroke. °· Heart attack. °· Injury to  other organs, such as kidneys, lungs, or liver. °· Preeclampsia. °· Convulsions or seizures. °· Placental abruption. °How does this affect my baby? °Hypertension during pregnancy can affect your baby. Your baby may: °· Be born early (prematurely). °· Not weigh as much as he or she should at birth (low birth weight). °· Not tolerate labor well, leading to an unplanned cesarean delivery. °What are the risks? °There are certain factors that make it more likely for you to develop hypertension during pregnancy. These include: °· Having hypertension during a previous pregnancy. °· Being overweight. °· Being age 35 or older. °· Being pregnant for the first time. °· Being pregnant with more than one baby. °· Becoming pregnant using fertilization methods, such as IVF (in vitro fertilization). °· Having other medical problems, such as diabetes, kidney disease, or lupus. °· Having a family history of hypertension. °What can I do to lower my risk? °The exact cause of hypertension during pregnancy is not known. You may be able to lower your risk by: °· Maintaining a healthy weight. °· Eating a healthy and balanced diet. °· Following your health care provider's instructions about treating any long-term conditions that you had before becoming pregnant. °It is very important to keep all of your prenatal care appointments. Your health care provider will check your blood pressure and make sure that your pregnancy is progressing as expected. If a problem is found, early treatment can prevent complications. °How is this treated? °Treatment for hypertension during pregnancy varies depending on the type of hypertension you have and how serious it is. °· If you were taking medicine for high blood pressure before you became pregnant, talk with your health care provider. You may need to change medicine during pregnancy because   some medicines, like ACE inhibitors, may not be considered safe for your baby. °· If you have gestational  hypertension, your health care provider may order medicine to treat this during pregnancy. °· If you are at risk for preeclampsia, your health care provider may recommend that you take a low-dose aspirin during your pregnancy. °· If you have severe hypertension, you may need to be hospitalized so you and your baby can be monitored closely. You may also need to be given medicine to lower your blood pressure. This medicine may be given by mouth or through an IV. °· In some cases, if your condition gets worse, you may need to deliver your baby early. °Follow these instructions at home: °Eating and drinking ° °· Drink enough fluid to keep your urine pale yellow. °· Avoid caffeine. °Lifestyle °· Do not use any products that contain nicotine or tobacco, such as cigarettes, e-cigarettes, and chewing tobacco. If you need help quitting, ask your health care provider. °· Do not use alcohol or drugs. °· Avoid stress as much as possible. °· Rest and get plenty of sleep. °· Regular exercise can help to reduce your blood pressure. Ask your health care provider what kinds of exercise are best for you. °General instructions °· Take over-the-counter and prescription medicines only as told by your health care provider. °· Keep all prenatal and follow-up visits as told by your health care provider. This is important. °Contact a health care provider if: °· You have symptoms that your health care provider told you may require more treatment or monitoring, such as: °? Headaches. °? Nausea or vomiting. °? Abdominal pain. °? Dizziness. °? Light-headedness. °Get help right away if: °· You have: °? Severe abdominal pain that does not get better with treatment. °? A severe headache that does not get better. °? Vomiting that does not get better. °? Sudden, rapid weight gain. °? Sudden swelling in your hands, ankles, or face. °? Vaginal bleeding. °? Blood in your urine. °? Blurred or double vision. °? Shortness of breath or chest  pain. °? Weakness on one side of your body. °? Difficulty speaking. °· Your baby is not moving as much as usual. °Summary °· High blood pressure (hypertension) is when the force of blood pumping through the arteries is too strong. °· Hypertension during pregnancy can cause problems for you and your baby. °· Treatment for hypertension during pregnancy varies depending on the type of hypertension you have and how serious it is. °· Keep all prenatal and follow-up visits as told by your health care provider. This is important. °This information is not intended to replace advice given to you by your health care provider. Make sure you discuss any questions you have with your health care provider. °Document Revised: 02/09/2019 Document Reviewed: 11/15/2018 °Elsevier Patient Education © 2020 [employer] ° ° ° ° °Fetal Movement Counts °Patient Name: ________________________________________________ Patient Due Date: ____________________ °What is a fetal movement count? ° °A fetal movement count is the number of times that you feel your baby move during a certain amount of time. This may also be called a fetal kick count. A fetal movement count is recommended for every pregnant woman. You may be asked to start counting fetal movements as early as week 28 of your pregnancy. °Pay attention to when your baby is most active. You may notice your baby's sleep and wake cycles. You may also notice things that make your baby move more. You should do a fetal movement count: °· When   your baby is normally most active. °· At the same time each day. °A good time to count movements is while you are resting, after having something to eat and drink. °How do I count fetal movements? °1. Find a quiet, comfortable area. Sit, or lie down on your side. °2. Write down the date, the start time and stop time, and the number of movements that you felt between those two times. Take this information with you to your health care visits. °3. Write down  your start time when you feel the first movement. °4. Count kicks, flutters, swishes, rolls, and jabs. You should feel at least 10 movements. °5. You may stop counting after you have felt 10 movements, or if you have been counting for 2 hours. Write down the stop time. °6. If you do not feel 10 movements in 2 hours, contact your health care provider for further instructions. Your health care provider may want to do additional tests to assess your baby's well-being. °Contact a health care provider if: °· You feel fewer than 10 movements in 2 hours. °· Your baby is not moving like he or she usually does. °Date: ____________ Start time: ____________ Stop time: ____________ Movements: ____________ °Date: ____________ Start time: ____________ Stop time: ____________ Movements: ____________ °Date: ____________ Start time: ____________ Stop time: ____________ Movements: ____________ °Date: ____________ Start time: ____________ Stop time: ____________ Movements: ____________ °Date: ____________ Start time: ____________ Stop time: ____________ Movements: ____________ °Date: ____________ Start time: ____________ Stop time: ____________ Movements: ____________ °Date: ____________ Start time: ____________ Stop time: ____________ Movements: ____________ °Date: ____________ Start time: ____________ Stop time: ____________ Movements: ____________ °Date: ____________ Start time: ____________ Stop time: ____________ Movements: ____________ °This information is not intended to replace advice given to you by your health care provider. Make sure you discuss any questions you have with your health care provider. °Document Revised: 06/08/2019 Document Reviewed: 06/08/2019 °Elsevier Patient Education © 2020 [employer] ° °

## 2020-09-04 NOTE — MAU Provider Note (Signed)
History     CSN: [id number]  Arrival date and time: 09/04/20 1608   First Provider Initiated Contact with Patient 09/04/20 1700      Chief Complaint  Patient presents with  . BP evaluation   HPI Holly Solis is a 29 y.o. [id number] at [redacted]w[redacted]d who presents from the office for BP evaluation. Patient with gestational hypertension diagnosis given last month. Denies hypertension outside of pregnancy. Was previously prescribed labetalol by her ob but didn't take it due to side effects. Was in the office this afternoon and BP higher than normal. Denies headache or epigastric pain. Does endorse some visual disturbance which has been ongoing for over a month. Denies OB complaints. Good fetal movement.  OB History    Gravida  4   Para  2   Term  2   Preterm      AB  1   Living  2     SAB  1   TAB      Ectopic      Multiple      Live Births  2           Past Medical History:  Diagnosis Date  . ADHD (attention deficit hyperactivity disorder)   . Diabetes mellitus without complication (HCC)    GDM  . Gestational diabetes   . History of kidney stones   . Hypothyroidism    last check normal    Past Surgical History:  Procedure Laterality Date  . LITHOTRIPSY    . TYMPANOSTOMY TUBE PLACEMENT      Family History  Problem Relation Age of Onset  . Diabetes Mother   . Hyperlipidemia Mother   . Asthma Father   . COPD Father   . Diabetes Father   . Hyperlipidemia Father   . Asthma Sister   . Cancer Maternal Grandmother   . Heart disease Maternal Grandmother   . Hypertension Maternal Grandmother   . Kidney disease Maternal Grandmother     Social History   Tobacco Use  . Smoking status: Former Smoker    Quit date: 01/22/2013    Years since quitting: 7.6  . Smokeless tobacco: Never Used  Substance Use Topics  . Alcohol use: No  . Drug use: No    Allergies: No Known Allergies  Medications Prior to Admission  Medication Sig Dispense Refill Last Dose   . acetaminophen (TYLENOL) 500 MG tablet Take 500 mg by mouth every 6 (six) hours as needed.   09/03/2020 at Unknown time  . Prenatal Vit-Fe Fumarate-FA (PRENATAL MULTIVITAMIN) TABS tablet Take 1 tablet by mouth daily at 12 noon.   09/03/2020 at Unknown time  . Blood Glucose Monitoring Suppl (FREESTYLE FREEDOM LITE) w/Device KIT      . FREESTYLE LITE test strip      . labetalol (NORMODYNE) 100 MG tablet Take 2 tablets (200 mg total) by mouth 2 (two) times daily. 30 tablet 2  at not taking  . Lancets (FREESTYLE) lancets      . metFORMIN (GLUCOPHAGE-XR) 500 MG 24 hr tablet Take 500 mg by mouth daily.     . metoCLOPramide (REGLAN) 10 MG tablet metoclopramide 10 mg tablet  TAKE 1 TABLET BY MOUTH THREE TIMES DAILY AS NEEDED       Review of Systems  Constitutional: Negative.   Eyes: Positive for visual disturbance.  Gastrointestinal: Negative.   Genitourinary: Negative.   Neurological: Negative for headaches.   Physical Exam   Blood pressure 133/86, pulse (!) 106, temperature  98.3 F (36.8 C), temperature source Oral, resp. rate 20, height _0  (1.6 m), weight 11.1 kg, SpO2 99 %, unknown if currently breastfeeding.   Patient Vitals for the past 24 hrs:  BP Temp Temp src Pulse Resp SpO2 Height Weight  09/04/20 1745 133/86 -- -- (!) 106 -- -- -- --  09/04/20 1732 118/64 -- -- 98 -- -- -- --  09/04/20 1716 (!) 115/97 -- -- 96 -- -- -- --  09/04/20 1710 -- -- -- -- -- 99 % -- --  09/04/20 1705 -- -- -- -- -- 99 % -- --  09/04/20 1701 (!) 149/104 -- -- (!) 116 -- 99 % -- --  09/04/20 1655 -- -- -- -- -- 99 % -- --  09/04/20 1650 -- -- -- -- -- 99 % -- --  09/04/20 1645 -- -- -- -- -- 99 % -- --  09/04/20 1642 (!) 143/101 -- -- (!) 116 -- -- -- --  09/04/20 1640 -- -- -- -- -- 99 % -- --  09/04/20 1621 (!) 147/92 98.3 F (36.8 C) Oral (!) 108 20 99 % -- --  09/04/20 1617 -- -- -- -- -- -- _1  (1.6 m) 11.1 kg     Physical Exam Vitals and nursing note reviewed.  Constitutional:       General: She is not in acute distress.    Appearance: Normal appearance.  Pulmonary:     Effort: Pulmonary effort is normal. No respiratory distress.  Neurological:     Mental Status: She is alert.  Psychiatric:        Mood and Affect: Mood normal.        Behavior: Behavior normal.    Fetal Tracing:  Baseline: 135 Variability:moderate Accelerations: 15x15 Decelerations:none  Toco: none    MAU Course  Procedures Results for orders placed or performed during the hospital encounter of 09/04/20 (from the past 24 hour(s))  CBC     Status: Abnormal   Collection Time: 09/04/20  4:25 PM  Result Value Ref Range   WBC 9.2 4.0 - 10.5 K/uL   RBC 3.95 3.87 - 5.11 MIL/uL   Hemoglobin 12.2 12.0 - 15.0 g/dL   HCT 34.9 (L) 36 - 46 %   MCV 88.4 80.0 - 100.0 fL   MCH 30.9 26.0 - 34.0 pg   MCHC 35.0 30.0 - 36.0 g/dL   RDW 13.2 11.5 - 15.5 %   Platelets 151 150 - 400 K/uL   nRBC 0.0 0.0 - 0.2 %  Comprehensive metabolic panel     Status: Abnormal   Collection Time: 09/04/20  4:25 PM  Result Value Ref Range   Sodium 134 (L) 135 - 145 mmol/L   Potassium 4.1 3.5 - 5.1 mmol/L   Chloride 104 98 - 111 mmol/L   CO2 22 22 - 32 mmol/L   Glucose, Bld 118 (H) 70 - 99 mg/dL   BUN 8 6 - 20 mg/dL   Creatinine, Ser 0.62 0.44 - 1.00 mg/dL   Calcium 9.1 8.9 - 10.3 mg/dL   Total Protein 5.7 (L) 6.5 - 8.1 g/dL   Albumin 2.7 (L) 3.5 - 5.0 g/dL   AST 18 15 - 41 U/L   ALT 15 0 - 44 U/L   Alkaline Phosphatase 92 38 - 126 U/L   Total Bilirubin 0.5 0.3 - 1.2 mg/dL   GFR, Estimated >60 >60 mL/min   Anion gap 8 5 - 15  Protein / creatinine ratio, urine  Status: None   Collection Time: 09/04/20  4:44 PM  Result Value Ref Range   Creatinine, Urine 73.69 mg/dL   Total Protein, Urine <6 mg/dL   Protein Creatinine Ratio        0.00 - 0.15 mg/mg[Cre]  Urinalysis, Routine w reflex microscopic Urine, Clean Catch     Status: Abnormal   Collection Time: 09/04/20  4:44 PM  Result Value Ref Range   Color,  Urine YELLOW YELLOW   APPearance HAZY (A) CLEAR   Specific Gravity, Urine 1.011 1.005 - 1.030   pH 7.0 5.0 - 8.0   Glucose, UA NEGATIVE NEGATIVE mg/dL   Hgb urine dipstick NEGATIVE NEGATIVE   Bilirubin Urine NEGATIVE NEGATIVE   Ketones, ur NEGATIVE NEGATIVE mg/dL   Protein, ur NEGATIVE NEGATIVE mg/dL   Nitrite NEGATIVE NEGATIVE   Leukocytes,Ua NEGATIVE NEGATIVE    MDM Some elevated BPs in MAU. None severe range & patient is asymptomatic. Preeclampsia labs collected & are normal.   Dr. Marvel Plan called prior to patient's arrival - pt has appointment tomorrow morning for ultrasound & to see Dr. Willis Modena.   Betamethasone given in MAU per Dr. Nena Alexander request. Patient will return to MAU tomorrow for second dose.   Assessment and Plan   1. Gestational hypertension, third trimester   2. [redacted] weeks gestation of pregnancy    -return to MAU tomorrow around 530 pm for second dose of BMZ -reviewed preeclampsia s/s & reasons to return -Keep appointment in office tomorrow   Jorje Guild 09/04/2020, 5:01 PM

## 2020-09-04 NOTE — MAU Note (Signed)
Sent from MD office for BP evaluation.  Denies H/A and epigastric pain, endorses visual disturbances, seeing spots.  Reports +FM.  Denies VB or LOF.

## 2020-09-05 ENCOUNTER — Other Ambulatory Visit: Payer: Self-pay

## 2020-09-05 ENCOUNTER — Inpatient Hospital Stay (HOSPITAL_COMMUNITY)
Admission: AD | Admit: 2020-09-05 | Discharge: 2020-09-05 | Disposition: A | Discharge status: Home / Self Care | Payer: 59 | Attending: Obstetrics and Gynecology | Admitting: Obstetrics and Gynecology

## 2020-09-05 DIAGNOSIS — Z3A35 35 weeks gestation of pregnancy: Secondary | ICD-10-CM | POA: Diagnosis not present

## 2020-09-05 DIAGNOSIS — O26893 Other specified pregnancy related conditions, third trimester: Secondary | ICD-10-CM | POA: Insufficient documentation

## 2020-09-05 DIAGNOSIS — Z3A36 36 weeks gestation of pregnancy: Secondary | ICD-10-CM | POA: Insufficient documentation

## 2020-09-05 DIAGNOSIS — O133 Gestational [pregnancy-induced] hypertension without significant proteinuria, third trimester: Secondary | ICD-10-CM | POA: Diagnosis not present

## 2020-09-05 DIAGNOSIS — O24415 Gestational diabetes mellitus in pregnancy, controlled by oral hypoglycemic drugs: Secondary | ICD-10-CM | POA: Diagnosis not present

## 2020-09-05 DIAGNOSIS — O3663X0 Maternal care for excessive fetal growth, third trimester, not applicable or unspecified: Secondary | ICD-10-CM | POA: Diagnosis not present

## 2020-09-05 MED ORDER — BETAMETHASONE SOD PHOS & ACET 6 (3-3) MG/ML IJ SUSP
12.0000 mg | Freq: Once | INTRAMUSCULAR | Status: AC
  Administered 2020-09-05: 12 mg via INTRAMUSCULAR

## 2020-09-05 NOTE — MAU Note (Signed)
Pt here for 2nd dose betamethasone. In office earlier for b/p check. Denies any pain cramping or headache. MSE by K.Kooistra,CNM.

## 2020-09-09 DIAGNOSIS — O24410 Gestational diabetes mellitus in pregnancy, diet controlled: Secondary | ICD-10-CM | POA: Diagnosis not present

## 2020-09-09 DIAGNOSIS — Z3A36 36 weeks gestation of pregnancy: Secondary | ICD-10-CM | POA: Diagnosis not present

## 2020-09-11 DIAGNOSIS — O24415 Gestational diabetes mellitus in pregnancy, controlled by oral hypoglycemic drugs: Secondary | ICD-10-CM | POA: Diagnosis not present

## 2020-09-11 DIAGNOSIS — Z3A36 36 weeks gestation of pregnancy: Secondary | ICD-10-CM | POA: Diagnosis not present

## 2020-09-11 DIAGNOSIS — O133 Gestational [pregnancy-induced] hypertension without significant proteinuria, third trimester: Secondary | ICD-10-CM | POA: Diagnosis not present

## 2020-09-11 DIAGNOSIS — Z3685 Encounter for antenatal screening for Streptococcus B: Secondary | ICD-10-CM | POA: Diagnosis not present

## 2020-09-12 ENCOUNTER — Encounter (HOSPITAL_COMMUNITY): Payer: Self-pay | Admitting: *Deleted

## 2020-09-12 ENCOUNTER — Telehealth (HOSPITAL_COMMUNITY): Payer: Self-pay | Admitting: *Deleted

## 2020-09-12 NOTE — Telephone Encounter (Signed)
Preadmission screen  

## 2020-09-13 ENCOUNTER — Other Ambulatory Visit (HOSPITAL_COMMUNITY): Payer: 59

## 2020-09-14 ENCOUNTER — Other Ambulatory Visit (HOSPITAL_COMMUNITY)
Admission: RE | Admit: 2020-09-14 | Discharge: 2020-09-14 | Disposition: A | Discharge status: Home / Self Care | Payer: 59 | Source: Ambulatory Visit | Attending: Obstetrics and Gynecology | Admitting: Obstetrics and Gynecology

## 2020-09-14 ENCOUNTER — Other Ambulatory Visit: Payer: Self-pay | Admitting: Obstetrics and Gynecology

## 2020-09-14 DIAGNOSIS — O134 Gestational [pregnancy-induced] hypertension without significant proteinuria, complicating childbirth: Secondary | ICD-10-CM | POA: Diagnosis not present

## 2020-09-14 DIAGNOSIS — Z87891 Personal history of nicotine dependence: Secondary | ICD-10-CM | POA: Diagnosis not present

## 2020-09-14 DIAGNOSIS — Z01812 Encounter for preprocedural laboratory examination: Secondary | ICD-10-CM | POA: Insufficient documentation

## 2020-09-14 DIAGNOSIS — O24425 Gestational diabetes mellitus in childbirth, controlled by oral hypoglycemic drugs: Secondary | ICD-10-CM | POA: Diagnosis not present

## 2020-09-14 DIAGNOSIS — Z3A37 37 weeks gestation of pregnancy: Secondary | ICD-10-CM | POA: Diagnosis not present

## 2020-09-14 DIAGNOSIS — O164 Unspecified maternal hypertension, complicating childbirth: Secondary | ICD-10-CM | POA: Diagnosis not present

## 2020-09-14 DIAGNOSIS — O99214 Obesity complicating childbirth: Secondary | ICD-10-CM | POA: Diagnosis not present

## 2020-09-14 DIAGNOSIS — Z20822 Contact with and (suspected) exposure to covid-19: Secondary | ICD-10-CM | POA: Insufficient documentation

## 2020-09-14 DIAGNOSIS — O133 Gestational [pregnancy-induced] hypertension without significant proteinuria, third trimester: Secondary | ICD-10-CM | POA: Diagnosis not present

## 2020-09-14 LAB — SARS CORONAVIRUS 2 (TAT 6-24 HRS): SARS Coronavirus 2: NEGATIVE

## 2020-09-14 NOTE — H&P (Signed)
Holly Solis is a 29 y.o. female [id number] at 51 0/7 weeks (EDD 10/06/20 by 12 week [place])  presenting for IOL for gestational hypertension starting at about 35 weeks.  All labs WNL and no severe persistent symptoms.  She also has GDM that is well-controlled on metformin q hs.  She had GDM with her prior two pregnancies, but HgbA1C and early glucola were WNL.  She received betamethasone  09/04/20 and 09/05/20.  Her first baby was LGA with a shoulder dystocia but no sequela.  EFW for this baby is AGA at 72%ile on 09/05/20   OB History    Gravida  4   Para  2   Term  2   Preterm      AB  1   Living  2     SAB  1   TAB      Ectopic      Multiple      Live Births  2         10-29-2013, 39.1 wks 1. M, 9lbs 1oz, Vacuum Extraction 03-26-2017, 37.6 wks 1. M, 7lbs 15oz, NSVD SAB x 1  Past Medical History:  Diagnosis Date  . ADHD (attention deficit hyperactivity disorder)   . Diabetes mellitus without complication (HCC)    GDM  . Gestational diabetes   . History of kidney stones   . Hypothyroidism    last check normal  . Pregnancy induced hypertension    Past Surgical History:  Procedure Laterality Date  . LITHOTRIPSY    . TYMPANOSTOMY TUBE PLACEMENT     Family History: family history includes Asthma in her father and sister; COPD in her father; Cancer in her maternal grandmother; Diabetes in her father and mother; Heart disease in her maternal grandmother; Hyperlipidemia in her father and mother; Hypertension in her maternal grandmother; Kidney disease in her maternal grandmother. Social History:  reports that she quit smoking about 7 years ago. She has never used smokeless tobacco. She reports that she does not drink alcohol and does not use drugs.     Maternal Diabetes: Yes:  Diabetes Type:  Insulin/Medication controlled Genetic Screening: Normal Maternal Ultrasounds/Referrals: Normal Fetal Ultrasounds or other Referrals:  None Maternal Substance Abuse:   No Significant Maternal Medications:  Meds include: Other: metformin Significant Maternal Lab Results:  Group B Strep negative Other Comments:  None  Review of Systems  Constitutional: Negative for fever.  Gastrointestinal: Negative for abdominal pain.  Neurological: Negative for headaches.   Maternal Medical History:  Contractions: Frequency: irregular.   Perceived severity is mild.    Fetal activity: Perceived fetal activity is normal.    Prenatal complications: PIH.   Prenatal Complications - Diabetes: gestational. Diabetes is managed by oral agent (monotherapy).        unknown if currently breastfeeding. Maternal Exam:  Uterine Assessment: Contraction strength is mild.  Contraction frequency is irregular.   Abdomen: Patient reports no abdominal tenderness. Fetal presentation: vertex  Introitus: Normal vulva. Normal vagina.  Pelvis: adequate for delivery.      Physical Exam Cardiovascular:     Rate and Rhythm: Normal rate and regular rhythm.  Pulmonary:     Effort: Pulmonary effort is normal.     Breath sounds: Normal breath sounds.  Genitourinary:    General: Normal vulva.  Musculoskeletal:        General: Swelling present.  Neurological:     Mental Status: She is alert.  Psychiatric:        Mood and Affect: Mood  normal.     Prenatal labs: ABO, Rh:  O positive Antibody:  neg Rubella:  Immune RPR:   NR HBsAg:Neg    HIV:   NR GBS:   Neg Carrier screen negative in prior pregnancy NIPT low risk  Assessment/Plan: IOL planned with pitocin and AROM when able.  Epidural prn.  Monitor BP and check PIH labs on admission.    Oliver Pila 09/14/2020, 10:29 PM

## 2020-09-14 NOTE — H&P (Deleted)
  The note originally documented on this encounter has been moved the the encounter in which it belongs.  

## 2020-09-15 ENCOUNTER — Inpatient Hospital Stay (HOSPITAL_COMMUNITY)
Admission: AD | Admit: 2020-09-15 | Discharge: 2020-09-17 | DRG: 807 | Disposition: A | Discharge status: Home / Self Care | Payer: 59 | Attending: Obstetrics and Gynecology | Admitting: Obstetrics and Gynecology

## 2020-09-15 ENCOUNTER — Other Ambulatory Visit: Payer: Self-pay

## 2020-09-15 ENCOUNTER — Encounter (HOSPITAL_COMMUNITY): Payer: Self-pay | Admitting: Obstetrics and Gynecology

## 2020-09-15 ENCOUNTER — Inpatient Hospital Stay (HOSPITAL_COMMUNITY): Payer: 59

## 2020-09-15 ENCOUNTER — Inpatient Hospital Stay (HOSPITAL_COMMUNITY): Payer: 59 | Admitting: Anesthesiology

## 2020-09-15 DIAGNOSIS — Z20822 Contact with and (suspected) exposure to covid-19: Secondary | ICD-10-CM | POA: Diagnosis present

## 2020-09-15 DIAGNOSIS — O24425 Gestational diabetes mellitus in childbirth, controlled by oral hypoglycemic drugs: Secondary | ICD-10-CM | POA: Diagnosis present

## 2020-09-15 DIAGNOSIS — O99214 Obesity complicating childbirth: Secondary | ICD-10-CM | POA: Diagnosis present

## 2020-09-15 DIAGNOSIS — Z87891 Personal history of nicotine dependence: Secondary | ICD-10-CM

## 2020-09-15 DIAGNOSIS — Z3A37 37 weeks gestation of pregnancy: Secondary | ICD-10-CM

## 2020-09-15 DIAGNOSIS — O133 Gestational [pregnancy-induced] hypertension without significant proteinuria, third trimester: Secondary | ICD-10-CM | POA: Diagnosis present

## 2020-09-15 DIAGNOSIS — O134 Gestational [pregnancy-induced] hypertension without significant proteinuria, complicating childbirth: Secondary | ICD-10-CM | POA: Diagnosis present

## 2020-09-15 DIAGNOSIS — O164 Unspecified maternal hypertension, complicating childbirth: Secondary | ICD-10-CM | POA: Diagnosis not present

## 2020-09-15 LAB — CBC
HCT: 37.5 % (ref 36.0–46.0)
HCT: 37 % (ref 36.0–46.0)
Hemoglobin: 12.8 g/dL (ref 12.0–15.0)
Hemoglobin: 12.7 g/dL (ref 12.0–15.0)
MCH: 30.8 pg (ref 26.0–34.0)
MCH: 30.3 pg (ref 26.0–34.0)
MCHC: 34.1 g/dL (ref 30.0–36.0)
MCHC: 34.3 g/dL (ref 30.0–36.0)
MCV: 90.4 fL (ref 80.0–100.0)
MCV: 88.3 fL (ref 80.0–100.0)
Platelets: 150 10*3/uL (ref 150–400)
Platelets: 134 10*3/uL — ABNORMAL LOW (ref 150–400)
RBC: 4.15 MIL/uL (ref 3.87–5.11)
RBC: 4.19 MIL/uL (ref 3.87–5.11)
RDW: 14.1 % (ref 11.5–15.5)
RDW: 13.7 % (ref 11.5–15.5)
WBC: 8.9 10*3/uL (ref 4.0–10.5)
WBC: 14.2 10*3/uL — ABNORMAL HIGH (ref 4.0–10.5)
nRBC: 0 % (ref 0.0–0.2)
nRBC: 0 % (ref 0.0–0.2)

## 2020-09-15 LAB — COMPREHENSIVE METABOLIC PANEL
ALT: 15 U/L (ref 0–44)
AST: 26 U/L (ref 15–41)
Albumin: 2.8 g/dL — ABNORMAL LOW (ref 3.5–5.0)
Alkaline Phosphatase: 122 U/L (ref 38–126)
Anion gap: 12 (ref 5–15)
BUN: 7 mg/dL (ref 6–20)
CO2: 18 mmol/L — ABNORMAL LOW (ref 22–32)
Calcium: 8.7 mg/dL — ABNORMAL LOW (ref 8.9–10.3)
Chloride: 102 mmol/L (ref 98–111)
Creatinine, Ser: 0.67 mg/dL (ref 0.44–1.00)
GFR, Estimated: >60 mL/min (ref >60)
Glucose, Bld: 229 mg/dL — ABNORMAL HIGH (ref 70–99)
Potassium: 3.8 mmol/L (ref 3.5–5.1)
Sodium: 132 mmol/L — ABNORMAL LOW (ref 135–145)
Total Bilirubin: 0.5 mg/dL (ref 0.3–1.2)
Total Protein: 5.6 g/dL — ABNORMAL LOW (ref 6.5–8.1)

## 2020-09-15 LAB — GLUCOSE, CAPILLARY
Glucose-Capillary: 209 mg/dL — ABNORMAL HIGH (ref 70–99)
Glucose-Capillary: 136 mg/dL — ABNORMAL HIGH (ref 70–99)
Glucose-Capillary: 89 mg/dL (ref 70–99)

## 2020-09-15 LAB — PROTEIN / CREATININE RATIO, URINE
Creatinine, Urine: 93.36 mg/dL
Protein Creatinine Ratio: 0.13 mg/mg{Cre} (ref 0.00–0.15)
Total Protein, Urine: 12 mg/dL

## 2020-09-15 LAB — TYPE AND SCREEN
ABO/RH(D): O POS
Antibody Screen: NEGATIVE

## 2020-09-15 LAB — RPR: RPR Ser Ql: NONREACTIVE

## 2020-09-15 MED ORDER — EPHEDRINE 5 MG/ML INJ: 10.0000 mg | INTRAVENOUS | Status: DC | PRN

## 2020-09-15 MED ORDER — LACTATED RINGERS IV SOLN: INTRAVENOUS | Status: DC

## 2020-09-15 MED ORDER — ACETAMINOPHEN 325 MG PO TABS: 650.0000 mg | ORAL_TABLET | ORAL | Status: DC | PRN

## 2020-09-15 MED ORDER — BUTORPHANOL TARTRATE 1 MG/ML IJ SOLN: 1.0000 mg | INTRAMUSCULAR | Status: DC | PRN

## 2020-09-15 MED ORDER — DIPHENHYDRAMINE HCL 25 MG PO CAPS: 25.0000 mg | ORAL_CAPSULE | Freq: Four times a day (QID) | ORAL | Status: DC | PRN

## 2020-09-15 MED ORDER — ZOLPIDEM TARTRATE 5 MG PO TABS: 5.0000 mg | ORAL_TABLET | Freq: Every evening | ORAL | Status: DC | PRN

## 2020-09-15 MED ORDER — OXYTOCIN-SODIUM CHLORIDE 30-0.9 UT/500ML-% IV SOLN: 2.5000 [IU]/h | INTRAVENOUS | Status: DC

## 2020-09-15 MED ORDER — OXYCODONE-ACETAMINOPHEN 5-325 MG PO TABS: 1.0000 | ORAL_TABLET | ORAL | Status: DC | PRN

## 2020-09-15 MED ORDER — PRENATAL MULTIVITAMIN CH
1.0000 | ORAL_TABLET | Freq: Every day | ORAL | Status: DC
  Administered 2020-09-16 – 2020-09-17 (×2): 1 via ORAL
  Filled 2020-09-15 (×2): qty 1

## 2020-09-15 MED ORDER — IBUPROFEN 600 MG PO TABS
600.0000 mg | ORAL_TABLET | Freq: Four times a day (QID) | ORAL | Status: DC
  Administered 2020-09-15 – 2020-09-17 (×8): 600 mg via ORAL
  Filled 2020-09-15 (×8): qty 1

## 2020-09-15 MED ORDER — TRANEXAMIC ACID-NACL 1000-0.7 MG/100ML-% IV SOLN
INTRAVENOUS | Status: AC
  Filled 2020-09-15: qty 100

## 2020-09-15 MED ORDER — PHENYLEPHRINE 40 MCG/ML (10ML) SYRINGE FOR IV PUSH (FOR BLOOD PRESSURE SUPPORT): 80.0000 ug | PREFILLED_SYRINGE | INTRAVENOUS | Status: DC | PRN

## 2020-09-15 MED ORDER — LIDOCAINE HCL (PF) 1 % IJ SOLN: 30.0000 mL | INTRAMUSCULAR | Status: DC | PRN

## 2020-09-15 MED ORDER — LACTATED RINGERS IV SOLN: 500.0000 mL | INTRAVENOUS | Status: DC | PRN

## 2020-09-15 MED ORDER — TERBUTALINE SULFATE 1 MG/ML IJ SOLN: 0.2500 mg | Freq: Once | INTRAMUSCULAR | Status: DC | PRN

## 2020-09-15 MED ORDER — WITCH HAZEL-GLYCERIN EX PADS: 1.0000 "application " | MEDICATED_PAD | CUTANEOUS | Status: DC | PRN

## 2020-09-15 MED ORDER — FENTANYL-BUPIVACAINE-NACL 0.5-0.125-0.9 MG/250ML-% EP SOLN
EPIDURAL | Status: AC
  Filled 2020-09-15: qty 250

## 2020-09-15 MED ORDER — TRANEXAMIC ACID-NACL 1000-0.7 MG/100ML-% IV SOLN
1000.0000 mg | Freq: Once | INTRAVENOUS | Status: AC
  Administered 2020-09-15: 1000 mg via INTRAVENOUS

## 2020-09-15 MED ORDER — SODIUM CHLORIDE (PF) 0.9 % IJ SOLN
INTRAMUSCULAR | Status: DC | PRN
  Administered 2020-09-15: 12 mL/h via EPIDURAL

## 2020-09-15 MED ORDER — OXYCODONE-ACETAMINOPHEN 5-325 MG PO TABS: 2.0000 | ORAL_TABLET | ORAL | Status: DC | PRN

## 2020-09-15 MED ORDER — COCONUT OIL OIL
1.0000 "application " | TOPICAL_OIL | Status: DC | PRN
  Administered 2020-09-16: 1 via TOPICAL

## 2020-09-15 MED ORDER — FENTANYL-BUPIVACAINE-NACL 0.5-0.125-0.9 MG/250ML-% EP SOLN: 12.0000 mL/h | EPIDURAL | Status: DC | PRN

## 2020-09-15 MED ORDER — SOD CITRATE-CITRIC ACID 500-334 MG/5ML PO SOLN: 30.0000 mL | ORAL | Status: DC | PRN

## 2020-09-15 MED ORDER — ONDANSETRON HCL 4 MG PO TABS: 4.0000 mg | ORAL_TABLET | ORAL | Status: DC | PRN

## 2020-09-15 MED ORDER — LIDOCAINE HCL (PF) 1 % IJ SOLN
INTRAMUSCULAR | Status: DC | PRN
  Administered 2020-09-15: 9 mL via EPIDURAL

## 2020-09-15 MED ORDER — ONDANSETRON HCL 4 MG/2ML IJ SOLN: 4.0000 mg | Freq: Four times a day (QID) | INTRAMUSCULAR | Status: DC | PRN

## 2020-09-15 MED ORDER — SIMETHICONE 80 MG PO CHEW: 80.0000 mg | CHEWABLE_TABLET | ORAL | Status: DC | PRN

## 2020-09-15 MED ORDER — SENNOSIDES-DOCUSATE SODIUM 8.6-50 MG PO TABS
2.0000 | ORAL_TABLET | ORAL | Status: DC
  Administered 2020-09-15: 2 via ORAL
  Filled 2020-09-15: qty 2

## 2020-09-15 MED ORDER — OXYTOCIN-SODIUM CHLORIDE 30-0.9 UT/500ML-% IV SOLN
1.0000 m[IU]/min | INTRAVENOUS | Status: DC
  Administered 2020-09-15: 2 m[IU]/min via INTRAVENOUS
  Filled 2020-09-15: qty 500

## 2020-09-15 MED ORDER — DIBUCAINE (PERIANAL) 1 % EX OINT: 1.0000 "application " | TOPICAL_OINTMENT | CUTANEOUS | Status: DC | PRN

## 2020-09-15 MED ORDER — ACETAMINOPHEN 325 MG PO TABS
650.0000 mg | ORAL_TABLET | ORAL | Status: DC | PRN
  Administered 2020-09-16: 650 mg via ORAL
  Filled 2020-09-15: qty 2

## 2020-09-15 MED ORDER — DIPHENHYDRAMINE HCL 50 MG/ML IJ SOLN: 12.5000 mg | INTRAMUSCULAR | Status: DC | PRN

## 2020-09-15 MED ORDER — MISOPROSTOL 200 MCG PO TABS
ORAL_TABLET | ORAL | Status: AC
  Filled 2020-09-15: qty 5

## 2020-09-15 MED ORDER — TETANUS-DIPHTH-ACELL PERTUSSIS 5-2.5-18.5 LF-MCG/0.5 IM SUSY: 0.5000 mL | PREFILLED_SYRINGE | Freq: Once | INTRAMUSCULAR | Status: DC

## 2020-09-15 MED ORDER — BENZOCAINE-MENTHOL 20-0.5 % EX AERO
1.0000 "application " | INHALATION_SPRAY | CUTANEOUS | Status: DC | PRN
  Administered 2020-09-15: 1 via TOPICAL
  Filled 2020-09-15: qty 56

## 2020-09-15 MED ORDER — LACTATED RINGERS IV SOLN: 500.0000 mL | Freq: Once | INTRAVENOUS | Status: DC

## 2020-09-15 MED ORDER — ONDANSETRON HCL 4 MG/2ML IJ SOLN: 4.0000 mg | INTRAMUSCULAR | Status: DC | PRN

## 2020-09-15 MED ORDER — OXYTOCIN BOLUS FROM INFUSION
333.0000 mL | Freq: Once | INTRAVENOUS | Status: AC
  Administered 2020-09-15: 333 mL via INTRAVENOUS

## 2020-09-15 NOTE — Progress Notes (Signed)
Patient ID: Holly Solis, female   DOB: [birth year], 29 y.o.   MRN: [id number] Pt admitted and started on pitocin.  Feeling mild cramping  afeb  BP stable at 130's/70's-80's  Cervix 2/40/-3  Will AROM when station a bit lower FHR category 1 BS 209 on CBG but pt admits ate a chicken biscuit just before admission.  Will recheck in one hour and q 4 to be sure comes down to normal.

## 2020-09-15 NOTE — Anesthesia Preprocedure Evaluation (Signed)
Anesthesia Evaluation  Patient identified by MRN, date of birth, ID band Patient awake    Reviewed: Allergy & Precautions, Patient's Chart, lab work & pertinent test results  Airway Mallampati: II       Dental no notable dental hx.    Pulmonary former smoker,    Pulmonary exam normal        Cardiovascular hypertension, Normal cardiovascular exam     Neuro/Psych    GI/Hepatic negative GI ROS, Neg liver ROS,   Endo/Other  diabetes, Gestational, Oral Hypoglycemic AgentsMorbid obesity  Renal/GU negative Renal ROS  negative genitourinary   Musculoskeletal negative musculoskeletal ROS (+)   Abdominal (+) + obese,   Peds  Hematology   Anesthesia Other Findings   Reproductive/Obstetrics                             Anesthesia Physical  Anesthesia Plan  ASA: III  Anesthesia Plan: Epidural   Post-op Pain Management:    Induction:   PONV Risk Score and Plan:   Airway Management Planned:   Additional Equipment:   Intra-op Plan:   Post-operative Plan:   Informed Consent:   Plan Discussed with:   Anesthesia Plan Comments:         Anesthesia Quick Evaluation

## 2020-09-15 NOTE — Lactation Note (Signed)
This note was copied from a baby's chart. Lactation Consultation Note  Patient Name: Holly Solis [id number] Date: 09/15/2020 Reason for consult: Initial assessment;Maternal endocrine disorder;Early term 37-38.6wks Type of Endocrine Disorder?: Diabetes (GDM on metformin and hypothyroid)  Visited with mom of a 3 hours old ETI female, she's a P3 and experienced BF. She BF her first baby for 3 months and her second one for 6 months, she plans BF this baby "all the way around" till 1 year. Mom is a Runner, broadcasting/film/video, and she'll be picking up her Medela DEBP prior discharge, LC left the pump form for her to look it up and she'll let her RN know when she's ready to see lactation again for her pump issuance.   Mom is a NT in the [employer] and she's very knowledgeable about BF. She brought her own colostrum containers ([place]) that she was using the feed her baby, she told LC those are better than finger, syringe or spoon feeding, praised her for her efforts. First serum glucose was at 62 and WNL, still awaiting for the second one.  Offered assistance with latch but she politely told LC that baby already ate, she was feeding her EBM at the time of [facility] consultation. Asked mom to call for assistance when needed. Reviewed normal newborn behavior, feeding cues and cluster feeding.   Feeding plan:  1. Encouraged mom to feed baby STS 8-12 times/24 hours or sooner if feeding cues are present 2. Hand expression and EBM feeding (with her own colostrum containers) were also encouraged 3. RN will page lactation for pump issuance when mom is ready (she has the form)  Dad present and very supportive. Mom is doing great with BF but she's aware to call if anything arises. Parents reported all questions and concerns were answered, they're both aware of LC OP services and will call PRN.     Maternal Data Formula Feeding for Exclusion: No Has patient been taught Hand Expression?: Yes Does the patient have  breastfeeding experience prior to this delivery?: Yes  Feeding Feeding Type: Breast Fed  LATCH Score Latch: Grasps breast easily, tongue down, lips flanged, rhythmical sucking.  Audible Swallowing: Spontaneous and intermittent  Type of Nipple: Everted at rest and after stimulation  Comfort (Breast/Nipple): Soft / non-tender  Hold (Positioning): No assistance needed to correctly position infant at breast.  LATCH Score: 10  Interventions Interventions: Breast feeding basics reviewed  Lactation Tools Discussed/Used WIC Program: No   Consult Status Consult Status: Follow-up Date: 09/16/20 Follow-up type: In-patient    Holly Solis Holly Solis 09/15/2020, 10:13 PM

## 2020-09-15 NOTE — Progress Notes (Signed)
Patient ID: Holly Solis, female   DOB: 20-Mar-1991, 29 y.o.   MRN: [id number] Pt getting uncomfortable and probably will proceed with epidural   FHR category 1 BP all WNL  Cervix 70/2-3/-2 AROM clear  Epidural as desired BS improved to 130's All PIH labs WNL

## 2020-09-15 NOTE — Progress Notes (Signed)
Patient ID: Holly Solis, female   DOB: September 16, 1991, 29 y.o.   MRN: [id number] Pt comfortable  afeb BP 120-130/70-80  Cervix 80/4-5/-1  Having mild variable decelerations with contractions, FSE placed Placed on peanut.   Follow progress Pitocin at 92mu  MVU 160-170

## 2020-09-15 NOTE — Progress Notes (Signed)
Patient ID: Holly Solis, female   DOB: 11/09/1990, 29 y.o.   MRN: [id number] Comfortable with epidural IUPC placed--MVU at 90-100, will increase ptiocin to establish adequate labor  FHR Category 1 Cervix 3/70/-2  Follow progress

## 2020-09-15 NOTE — Anesthesia Procedure Notes (Signed)
Epidural Patient location during procedure: OB Start time: 09/15/2020 1:05 PM End time: 09/15/2020 1:09 PM  Staffing Anesthesiologist: Leilani Able, MD Performed: anesthesiologist   Preanesthetic Checklist Completed: patient identified, IV checked, site marked, risks and benefits discussed, surgical consent, monitors and equipment checked, pre-op evaluation and timeout performed  Epidural Patient position: sitting Prep: DuraPrep and site prepped and draped Patient monitoring: continuous pulse ox and blood pressure Approach: midline Injection technique: LOR air  Needle:  Needle type: Tuohy  Needle gauge: 17 G Needle length: 9 cm and 9 Needle insertion depth: 6 cm Catheter type: closed end flexible Catheter size: 19 Gauge Catheter at skin depth: 11 cm Test dose: negative and Other  Assessment Events: blood not aspirated, injection not painful, no injection resistance, no paresthesia and negative IV test  Additional Notes Reason for block:procedure for pain

## 2020-09-16 LAB — CBC
HCT: 34.3 % — ABNORMAL LOW (ref 36.0–46.0)
Hemoglobin: 11.6 g/dL — ABNORMAL LOW (ref 12.0–15.0)
MCH: 30.4 pg (ref 26.0–34.0)
MCHC: 33.8 g/dL (ref 30.0–36.0)
MCV: 90 fL (ref 80.0–100.0)
Platelets: 133 10*3/uL — ABNORMAL LOW (ref 150–400)
RBC: 3.81 MIL/uL — ABNORMAL LOW (ref 3.87–5.11)
RDW: 13.9 % (ref 11.5–15.5)
WBC: 10.7 10*3/uL — ABNORMAL HIGH (ref 4.0–10.5)
nRBC: 0 % (ref 0.0–0.2)

## 2020-09-16 NOTE — Lactation Note (Signed)
This note was copied from a baby's chart. Lactation Consultation Note  Patient Name: Holly Solis Date: 09/16/2020 Reason for consult: Follow-up assessment   Father reports that mother is out of room getting something to eat. Introduce myself and informed him to make mother aware that [facility] were available and will come back to room.    Maternal Data    Feeding Feeding Type: Breast Fed  LATCH Score                   Interventions    Lactation Tools Discussed/Used     Consult Status      Michel Bickers 09/16/2020, 2:45 PM

## 2020-09-16 NOTE — Lactation Note (Signed)
This note was copied from a baby's chart. Lactation Consultation Note  Patient Name: Holly Solis [id number] Date: 09/16/2020 Reason for consult: Follow-up assessment Type of Endocrine Disorder?: Diabetes  Follow up with 22 hours old infant with 3.04% weight loss of a P3 mother with breastfeeding experience. Infant is latched football position to left breast upon arrival. Observed a good deep latch, flanged lips, breast tissue movement and rhythmic suckling. Mother states breastfeeding is going well and does not reports any problems at this point.   Mother is a Producer, television/film/video and had "[employer] Patient (employee) Breast Pump Form" pre-filled, insurance card ready and preferred pump selected. Brought preferred pump and copy of document for mother's records.   Encouraged to contact [facility] for support, questions or concerns.    All questions answered at this time.    Feeding Feeding Type: Breast Fed  LATCH Score Latch: Grasps breast easily, tongue down, lips flanged, rhythmical sucking.  Audible Swallowing: Spontaneous and intermittent  Type of Nipple: Everted at rest and after stimulation  Comfort (Breast/Nipple): Soft / non-tender  Hold (Positioning): No assistance needed to correctly position infant at breast.  LATCH Score: 10  Interventions Interventions: Breast feeding basics reviewed;Expressed milk;DEBP;Coconut oil  Lactation Tools Discussed/Used Tools: Pump   Consult Status Consult Status: Follow-up Date: 09/17/20 Follow-up type: In-patient    Holly Solis 09/16/2020, 5:05 PM

## 2020-09-16 NOTE — Anesthesia Postprocedure Evaluation (Signed)
Anesthesia Post Note  Patient: Holly Solis  Procedure(s) Performed: AN AD HOC LABOR EPIDURAL     Patient location during evaluation: Mother Baby Anesthesia Type: Epidural Level of consciousness: awake and alert and oriented Pain management: satisfactory to patient Vital Signs Assessment: post-procedure vital signs reviewed and stable Respiratory status: respiratory function stable Cardiovascular status: stable Postop Assessment: no headache, no backache, epidural receding, patient able to bend at knees, no signs of nausea or vomiting and adequate PO intake Anesthetic complications: no   No complications documented.  Last Vitals:  Vitals:   09/16/20 0137 09/16/20 0549  BP: 127/69 119/89  Pulse: 81 93  Resp: 18 16  Temp: 36.9 C 36.7 C  SpO2: 98% 99%    Last Pain:  Vitals:   09/16/20 0549  TempSrc: Oral  PainSc:    Pain Goal:                   Thong Feeny

## 2020-09-16 NOTE — Progress Notes (Signed)
Post Partum Day 1 Subjective: no complaints, up ad lib, voiding, tolerating PO and nl lochia, pain controlled  Objective: Blood pressure 119/89, pulse 93, temperature 98.1 F (36.7 C), temperature source Oral, resp. rate 16, height 5\' 3"  (1.6 m), weight 101.6 kg, SpO2 99 %, unknown if currently breastfeeding.  Physical Exam:  General: alert and no distress Lochia: appropriate Uterine Fundus: firm   Recent Labs    09/15/20 1921 09/16/20 0544  HGB 12.7 11.6*  HCT 37.0 34.3*    Assessment/Plan: Plan for discharge tomorrow, Breastfeeding and Lactation consult/  Routine PP care.     LOS: 1 day   Holly Solis 09/16/2020, 8:29 AM

## 2020-09-17 MED ORDER — IBUPROFEN 600 MG PO TABS: 600.0000 mg | ORAL_TABLET | Freq: Four times a day (QID) | ORAL | 1 refills | Status: AC | PRN

## 2020-09-17 NOTE — Lactation Note (Signed)
This note was copied from a baby's chart. Lactation Consultation Note  Patient Name: Holly Solis VANVB'T Date: 09/17/2020 Reason for consult: Follow-up assessment Type of Endocrine Disorder?: Diabetes   Mother paged for latch to be observed. Infant observed for 20 mins . With rhythmicsuckling and swallowing. . Mother also gave her 13 ml of ebm with a bottle after 20 mins of breast feeding infant burped. . infant tolerated feeding well.  Mother to pump again  After each feeding. Mother has her Sonata pump from the hospital.   Mother to continue to pump and feed infant back any amt of ebm after breastfeeding.  Plan is to weigh infant this after noon in hopes to go home   Maternal Data    Feeding Feeding Type: Bottle Fed - Breast Milk  LATCH Score Latch: Grasps breast easily, tongue down, lips flanged, rhythmical sucking.  Audible Swallowing: Spontaneous and intermittent  Type of Nipple: Everted at rest and after stimulation  Comfort (Breast/Nipple): Soft / non-tender  Hold (Positioning): Assistance needed to correctly position infant at breast and maintain latch. (flippled top lip up and tugged in chin for wider gape)  LATCH Score: 9  Interventions Interventions: Assisted with latch;Skin to skin;Breast compression;Adjust position;Support pillows;Position options;DEBP  Lactation Tools Discussed/Used     Consult Status Consult Status: Follow-up Date: 09/17/20 Follow-up type: In-patient    Stevan Born [facility] 09/17/2020, 10:44 AM

## 2020-09-17 NOTE — Progress Notes (Addendum)
Post Partum Day 2 Subjective: no complaints, up ad lib, voiding, tolerating PO, + flatus and lochia mild  Objective: Blood pressure 128/73, pulse 79, temperature 97.9 F (36.6 C), temperature source Oral, resp. rate 16, height 5\' 3"  (1.6 m), weight 101.6 kg, SpO2 99 %, unknown if currently breastfeeding.  Physical Exam:  General: alert, cooperative and no distress Lochia: appropriate Uterine Fundus: firm Incision: n/a DVT Evaluation: No evidence of DVT seen on physical exam. No significant calf/ankle edema.  Recent Labs    09/15/20 1921 09/16/20 0544  HGB 12.7 11.6*  HCT 37.0 34.3*    Assessment/Plan: Discharge home ; if baby has to stay due to weight loss, pt will nest Pt given instructions.    LOS: 2 days   09/18/20 Matthews Franks 09/17/2020, 10:58 AM

## 2020-09-17 NOTE — Discharge Summary (Signed)
Postpartum Discharge Summary  Date of Service updated      Patient Name: Holly Solis DOB: [birth year] MRN: [id number]  Date of admission: 09/15/2020 Delivery date:09/15/2020  Delivering provider: Paula Compton  Date of discharge: 09/17/2020  Admitting diagnosis: Gestational hypertension, third trimester [O13.3] NSVD (normal spontaneous vaginal delivery) [O80] Intrauterine pregnancy: [redacted]w[redacted]d     Secondary diagnosis:  Active Problems:   Gestational hypertension, third trimester   NSVD (normal spontaneous vaginal delivery)  Additional problems: GDMA2    Discharge diagnosis: Term Pregnancy Delivered, Gestational Hypertension and GDM A2                                              Post partum procedures:n/a Augmentation: AROM and Pitocin Complications: None  Hospital course: Induction of Labor With Vaginal Delivery   29 y.o. yo [phone] at [redacted]w[redacted]d was admitted to the hospital 09/15/2020 for induction of labor.  Indication for induction: Favorable cervix at term, Gestational hypertension and A2 DM.  Patient had an uncomplicated labor course as follows: Membrane Rupture Time/Date: 12:45 PM ,09/15/2020   Delivery Method:Vaginal, Spontaneous  Episiotomy: None  Lacerations:  None  Details of delivery can be found in separate delivery note.  Patient had a routine postpartum course. Patient is discharged home 09/17/20.  Newborn Data: Birth date:09/15/2020  Birth time:6:12 PM  Gender:Female  Living status:Living  Apgars:9 ,9  JYNWGN:5621 g   Magnesium Sulfate received: No BMZ received: Yes Rhophylac:N/A MMR:N/A Physical exam  Vitals:   09/16/20 0947 09/16/20 1255 09/16/20 2045 09/17/20 0625  BP: 120/79 123/62 132/84 128/73  Pulse: 84 71 83 79  Resp: $Remo'17 16 18 16  'cEThN$ Temp: 98.8 F (37.1 C) 98 F (36.7 C) 98.4 F (36.9 C) 97.9 F (36.6 C)  TempSrc: Axillary Oral Oral Oral  SpO2: 100%  98% 99%  Weight:      Height:       General: alert, cooperative and no  distress Lochia: appropriate Uterine Fundus: firm Incision: N/A DVT Evaluation: No evidence of DVT seen on physical exam. Labs: Lab Results  Component Value Date   WBC 10.7 (H) 09/16/2020   HGB 11.6 (L) 09/16/2020   HCT 34.3 (L) 09/16/2020   MCV 90.0 09/16/2020   PLT 133 (L) 09/16/2020   CMP Latest Ref Rng & Units 09/15/2020  Glucose 70 - 99 mg/dL 229(H)  BUN 6 - 20 mg/dL 7  Creatinine 0.44 - 1.00 mg/dL 0.67  Sodium 135 - 145 mmol/L 132(L)  Potassium 3.5 - 5.1 mmol/L 3.8  Chloride 98 - 111 mmol/L 102  CO2 22 - 32 mmol/L 18(L)  Calcium 8.9 - 10.3 mg/dL 8.7(L)  Total Protein 6.5 - 8.1 g/dL 5.6(L)  Total Bilirubin 0.3 - 1.2 mg/dL 0.5  Alkaline Phos 38 - 126 U/L 122  AST 15 - 41 U/L 26  ALT 0 - 44 U/L 15   Edinburgh Score: Edinburgh Postnatal Depression Scale Screening Tool 09/16/2020  I have been able to laugh and see the funny side of things. 0  I have looked forward with enjoyment to things. 0  I have blamed myself unnecessarily when things went wrong. 1  I have been anxious or worried for no good reason. 0  I have felt scared or panicky for no good reason. 0  Things have been getting on top of me. 0  I have been so unhappy that  I have had difficulty sleeping. 0  I have felt sad or miserable. 0  I have been so unhappy that I have been crying. 0  The thought of harming myself has occurred to me. 0  Edinburgh Postnatal Depression Scale Total 1      After visit meds:  Allergies as of 09/17/2020   No Known Allergies     Medication List    STOP taking these medications   acetaminophen 500 MG tablet Commonly known as: TYLENOL   FreeStyle Freedom Lite w/Device Kit   freestyle lancets   FREESTYLE LITE test strip Generic drug: glucose blood   metFORMIN 500 MG 24 hr tablet Commonly known as: GLUCOPHAGE-XR   metoCLOPramide 10 MG tablet Commonly known as: REGLAN   prenatal multivitamin Tabs tablet     TAKE these medications   ibuprofen 600 MG  tablet Commonly known as: ADVIL Take 1 tablet (600 mg total) by mouth every 6 (six) hours as needed.        Discharge home in stable condition Infant Feeding: Breast Infant Disposition:home with mother Discharge instruction: per After Visit Summary and Postpartum booklet. Activity: Advance as tolerated. Pelvic rest for 6 weeks.  Diet: carb modified diet and low salt diet Anticipated Birth Control: Unsure Postpartum Appointment:6 weeks Additional Postpartum F/U: BP check 1 week Future Appointments:No future appointments. Follow up Visit:  Follow-up Information    Paula Compton, MD. Schedule an appointment as soon as possible for a visit.   Specialty: Obstetrics and Gynecology Why: 1 week for BP check and 6 weeks for postpartum visit Contact information: [address] [place] [address] [phone]                   09/17/2020 Isaiah Serge, DO

## 2020-09-17 NOTE — Discharge Instructions (Signed)
Call office with any concerns [phone] 

## 2020-10-08 DIAGNOSIS — O139 Gestational [pregnancy-induced] hypertension without significant proteinuria, unspecified trimester: Secondary | ICD-10-CM | POA: Diagnosis not present

## 2020-10-08 MED FILL — NIFEdipine ER OSMOTIC RELEA: 30 | 30 days supply | Qty: 30 | Fill #0

## 2020-10-31 DIAGNOSIS — Z30430 Encounter for insertion of intrauterine contraceptive device: Secondary | ICD-10-CM | POA: Diagnosis not present

## 2020-10-31 DIAGNOSIS — Z1389 Encounter for screening for other disorder: Secondary | ICD-10-CM | POA: Diagnosis not present

## 2020-10-31 DIAGNOSIS — Z3009 Encounter for other general counseling and advice on contraception: Secondary | ICD-10-CM | POA: Diagnosis not present

## 2021-03-25 ENCOUNTER — Other Ambulatory Visit (HOSPITAL_COMMUNITY): Payer: Self-pay

## 2021-08-29 DIAGNOSIS — Z23 Encounter for immunization: Secondary | ICD-10-CM | POA: Diagnosis not present

## 2021-09-02 ENCOUNTER — Other Ambulatory Visit (HOSPITAL_COMMUNITY): Payer: Self-pay

## 2021-09-02 DIAGNOSIS — Z79899 Other long term (current) drug therapy: Secondary | ICD-10-CM | POA: Diagnosis not present

## 2021-09-02 DIAGNOSIS — F988 Other specified behavioral and emotional disorders with onset usually occurring in childhood and adolescence: Secondary | ICD-10-CM | POA: Diagnosis not present

## 2021-09-02 MED ORDER — AMPHETAMINE-DEXTROAMPHETAMINE 10 MG PO TABS
5.0000 mg | ORAL_TABLET | Freq: Every day | ORAL | 0 refills | Status: DC
Start: 2021-09-02 — End: 2021-10-09
  Filled 2021-09-02: qty 30, 30d supply, fill #0

## 2021-09-02 MED ORDER — AMPHETAMINE-DEXTROAMPHET ER 20 MG PO CP24
20.0000 mg | ORAL_CAPSULE | Freq: Every day | ORAL | 0 refills | Status: DC
  Filled 2021-09-02: qty 30, 30d supply, fill #0

## 2021-10-09 ENCOUNTER — Other Ambulatory Visit (HOSPITAL_COMMUNITY): Payer: Self-pay

## 2021-10-09 MED ORDER — AMPHETAMINE-DEXTROAMPHET ER 20 MG PO CP24
20.0000 mg | ORAL_CAPSULE | Freq: Every day | ORAL | 0 refills | Status: DC
  Filled 2021-10-09: qty 30, 30d supply, fill #0

## 2021-10-09 MED ORDER — AMPHETAMINE-DEXTROAMPHETAMINE 10 MG PO TABS
5.0000 mg | ORAL_TABLET | Freq: Every day | ORAL | 0 refills | Status: DC
Start: 2021-10-09 — End: 2021-11-28
  Filled 2021-10-09: qty 30, 30d supply, fill #0

## 2021-10-10 ENCOUNTER — Other Ambulatory Visit (HOSPITAL_COMMUNITY): Payer: Self-pay

## 2021-11-03 DIAGNOSIS — Z1389 Encounter for screening for other disorder: Secondary | ICD-10-CM | POA: Diagnosis not present

## 2021-11-03 DIAGNOSIS — Z130 Encounter for screening for diseases of the blood and blood-forming organs and certain disorders involving the immune mechanism: Secondary | ICD-10-CM | POA: Diagnosis not present

## 2021-11-03 DIAGNOSIS — Z30431 Encounter for routine checking of intrauterine contraceptive device: Secondary | ICD-10-CM | POA: Diagnosis not present

## 2021-11-03 DIAGNOSIS — Z01419 Encounter for gynecological examination (general) (routine) without abnormal findings: Secondary | ICD-10-CM | POA: Diagnosis not present

## 2021-11-03 DIAGNOSIS — Z3A33 33 weeks gestation of pregnancy: Secondary | ICD-10-CM | POA: Diagnosis not present

## 2021-11-03 DIAGNOSIS — R102 Pelvic and perineal pain: Secondary | ICD-10-CM | POA: Diagnosis not present

## 2021-11-03 DIAGNOSIS — N760 Acute vaginitis: Secondary | ICD-10-CM | POA: Diagnosis not present

## 2021-11-28 ENCOUNTER — Other Ambulatory Visit (HOSPITAL_COMMUNITY): Payer: Self-pay

## 2021-11-28 MED ORDER — AMPHETAMINE-DEXTROAMPHET ER 20 MG PO CP24
20.0000 mg | ORAL_CAPSULE | Freq: Every day | ORAL | 0 refills | Status: DC
  Filled 2021-11-28 (×2): qty 30, 30d supply, fill #0

## 2021-11-28 MED ORDER — AMPHETAMINE-DEXTROAMPHETAMINE 10 MG PO TABS
5.0000 mg | ORAL_TABLET | Freq: Every day | ORAL | 0 refills | Status: DC
  Filled 2021-11-28: qty 30, 30d supply, fill #0

## 2022-01-13 ENCOUNTER — Other Ambulatory Visit (HOSPITAL_COMMUNITY): Payer: Self-pay

## 2022-01-13 MED ORDER — AMPHETAMINE-DEXTROAMPHETAMINE 10 MG PO TABS
5.0000 mg | ORAL_TABLET | Freq: Every day | ORAL | 0 refills | Status: DC
  Filled 2022-01-13: qty 30, 30d supply, fill #0

## 2022-01-13 MED ORDER — AMPHETAMINE-DEXTROAMPHET ER 20 MG PO CP24
20.0000 mg | ORAL_CAPSULE | Freq: Every day | ORAL | 0 refills | Status: DC
  Filled 2022-01-13 (×2): qty 30, 30d supply, fill #0

## 2022-02-16 ENCOUNTER — Other Ambulatory Visit (HOSPITAL_COMMUNITY): Payer: Self-pay

## 2022-02-16 MED ORDER — AMPHETAMINE-DEXTROAMPHET ER 20 MG PO CP24
20.0000 mg | ORAL_CAPSULE | Freq: Every day | ORAL | 0 refills | Status: DC
  Filled 2022-02-16 (×2): qty 30, 30d supply, fill #0

## 2022-03-16 ENCOUNTER — Other Ambulatory Visit (HOSPITAL_COMMUNITY): Payer: Self-pay

## 2022-03-17 ENCOUNTER — Other Ambulatory Visit (HOSPITAL_COMMUNITY): Payer: Self-pay

## 2022-03-17 MED ORDER — AMPHETAMINE-DEXTROAMPHET ER 20 MG PO CP24
20.0000 mg | ORAL_CAPSULE | Freq: Every day | ORAL | 0 refills | Status: DC
  Filled 2022-03-17 (×2): qty 30, 30d supply, fill #0

## 2022-03-17 MED ORDER — AMPHETAMINE-DEXTROAMPHETAMINE 10 MG PO TABS
5.0000 mg | ORAL_TABLET | Freq: Every day | ORAL | 0 refills | Status: DC
  Filled 2022-03-17: qty 30, 30d supply, fill #0

## 2022-04-27 ENCOUNTER — Other Ambulatory Visit (HOSPITAL_COMMUNITY): Payer: Self-pay

## 2022-04-27 MED ORDER — AMPHETAMINE-DEXTROAMPHET ER 20 MG PO CP24
20.0000 mg | ORAL_CAPSULE | Freq: Every day | ORAL | 0 refills | Status: DC
  Filled 2022-04-27 (×4): qty 30, 30d supply, fill #0

## 2022-04-27 MED ORDER — AMPHETAMINE-DEXTROAMPHETAMINE 10 MG PO TABS
5.0000 mg | ORAL_TABLET | Freq: Every day | ORAL | 0 refills | Status: DC
  Filled 2022-04-27: qty 30, 30d supply, fill #0

## 2022-05-27 ENCOUNTER — Other Ambulatory Visit (HOSPITAL_COMMUNITY): Payer: Self-pay

## 2022-05-27 MED ORDER — AMPHETAMINE-DEXTROAMPHETAMINE 10 MG PO TABS
5.0000 mg | ORAL_TABLET | Freq: Every day | ORAL | 0 refills | Status: DC
  Filled 2022-05-27: qty 30, 30d supply, fill #0

## 2022-05-27 MED ORDER — AMPHETAMINE-DEXTROAMPHET ER 20 MG PO CP24
20.0000 mg | ORAL_CAPSULE | Freq: Every day | ORAL | 0 refills | Status: DC
  Filled 2022-05-27 (×3): qty 30, 30d supply, fill #0

## 2022-06-30 DIAGNOSIS — F4310 Post-traumatic stress disorder, unspecified: Secondary | ICD-10-CM | POA: Diagnosis not present

## 2022-06-30 DIAGNOSIS — F411 Generalized anxiety disorder: Secondary | ICD-10-CM | POA: Diagnosis not present

## 2022-07-09 ENCOUNTER — Other Ambulatory Visit (HOSPITAL_COMMUNITY): Payer: Self-pay

## 2022-07-09 MED ORDER — AMPHETAMINE-DEXTROAMPHETAMINE 10 MG PO TABS
5.0000 mg | ORAL_TABLET | Freq: Every day | ORAL | 0 refills | Status: DC
  Filled 2022-07-09: qty 30, 30d supply, fill #0

## 2022-07-09 MED ORDER — AMPHETAMINE-DEXTROAMPHET ER 20 MG PO CP24
20.0000 mg | ORAL_CAPSULE | Freq: Every day | ORAL | 0 refills | Status: DC
  Filled 2022-07-09 (×2): qty 30, 30d supply, fill #0

## 2022-07-10 ENCOUNTER — Other Ambulatory Visit (HOSPITAL_COMMUNITY): Payer: Self-pay

## 2022-08-06 ENCOUNTER — Other Ambulatory Visit (HOSPITAL_COMMUNITY): Payer: Self-pay

## 2022-08-06 MED ORDER — LORAZEPAM 0.5 MG PO TABS
0.5000 mg | ORAL_TABLET | Freq: Every evening | ORAL | 0 refills | Status: AC | PRN
  Filled 2022-08-06 – 2022-08-11 (×3): qty 30, 30d supply, fill #0

## 2022-08-07 ENCOUNTER — Other Ambulatory Visit (HOSPITAL_COMMUNITY): Payer: Self-pay

## 2022-08-07 MED ORDER — AMPHETAMINE-DEXTROAMPHETAMINE 10 MG PO TABS
5.0000 mg | ORAL_TABLET | Freq: Every day | ORAL | 0 refills | Status: DC
  Filled 2022-08-07 – 2022-08-11 (×2): qty 30, 30d supply, fill #0

## 2022-08-07 MED ORDER — AMPHETAMINE-DEXTROAMPHET ER 20 MG PO CP24
20.0000 mg | ORAL_CAPSULE | Freq: Every day | ORAL | 0 refills | Status: DC
  Filled 2022-08-07: qty 30, 30d supply, fill #0

## 2022-08-11 ENCOUNTER — Other Ambulatory Visit (HOSPITAL_COMMUNITY): Payer: Self-pay

## 2022-08-17 ENCOUNTER — Other Ambulatory Visit (HOSPITAL_COMMUNITY): Payer: Self-pay

## 2022-08-17 MED ORDER — CITALOPRAM HYDROBROMIDE 20 MG PO TABS
20.0000 mg | ORAL_TABLET | Freq: Every day | ORAL | 0 refills | Status: AC
  Filled 2022-08-17 – 2022-12-24 (×2): qty 30, 30d supply, fill #0

## 2022-08-18 ENCOUNTER — Other Ambulatory Visit (HOSPITAL_COMMUNITY): Payer: Self-pay

## 2022-08-25 ENCOUNTER — Other Ambulatory Visit (HOSPITAL_COMMUNITY): Payer: Self-pay

## 2022-09-09 ENCOUNTER — Other Ambulatory Visit (HOSPITAL_COMMUNITY): Payer: Self-pay

## 2022-09-10 ENCOUNTER — Other Ambulatory Visit (HOSPITAL_COMMUNITY): Payer: Self-pay

## 2022-09-10 MED ORDER — AMPHETAMINE-DEXTROAMPHET ER 20 MG PO CP24
20.0000 mg | ORAL_CAPSULE | Freq: Every day | ORAL | 0 refills | Status: DC
  Filled 2022-09-10 (×2): qty 30, 30d supply, fill #0

## 2022-09-10 MED ORDER — AMPHETAMINE-DEXTROAMPHETAMINE 10 MG PO TABS
5.0000 mg | ORAL_TABLET | Freq: Every day | ORAL | 0 refills | Status: DC
  Filled 2022-09-10 – 2022-09-17 (×3): qty 30, 30d supply, fill #0

## 2022-09-14 ENCOUNTER — Other Ambulatory Visit (HOSPITAL_COMMUNITY): Payer: Self-pay

## 2022-09-17 ENCOUNTER — Other Ambulatory Visit (HOSPITAL_COMMUNITY): Payer: Self-pay

## 2022-10-15 ENCOUNTER — Other Ambulatory Visit (HOSPITAL_COMMUNITY): Payer: Self-pay

## 2022-10-16 ENCOUNTER — Other Ambulatory Visit (HOSPITAL_COMMUNITY): Payer: Self-pay

## 2022-10-16 MED ORDER — AMPHETAMINE-DEXTROAMPHET ER 20 MG PO CP24
20.0000 mg | ORAL_CAPSULE | Freq: Every day | ORAL | 0 refills | Status: AC
  Filled 2022-10-16: qty 30, 30d supply, fill #0

## 2022-10-16 MED ORDER — AMPHETAMINE-DEXTROAMPHETAMINE 10 MG PO TABS
5.0000 mg | ORAL_TABLET | Freq: Every day | ORAL | 0 refills | Status: DC
  Filled 2022-10-16: qty 30, 30d supply, fill #0

## 2022-11-13 DIAGNOSIS — Z6830 Body mass index (BMI) 30.0-30.9, adult: Secondary | ICD-10-CM | POA: Diagnosis not present

## 2022-11-13 DIAGNOSIS — Z130 Encounter for screening for diseases of the blood and blood-forming organs and certain disorders involving the immune mechanism: Secondary | ICD-10-CM | POA: Diagnosis not present

## 2022-11-13 DIAGNOSIS — F988 Other specified behavioral and emotional disorders with onset usually occurring in childhood and adolescence: Secondary | ICD-10-CM | POA: Diagnosis not present

## 2022-11-13 DIAGNOSIS — F411 Generalized anxiety disorder: Secondary | ICD-10-CM | POA: Diagnosis not present

## 2022-11-13 DIAGNOSIS — Z13220 Encounter for screening for lipoid disorders: Secondary | ICD-10-CM | POA: Diagnosis not present

## 2022-11-13 DIAGNOSIS — R635 Abnormal weight gain: Secondary | ICD-10-CM | POA: Diagnosis not present

## 2022-11-16 ENCOUNTER — Other Ambulatory Visit (HOSPITAL_COMMUNITY): Payer: Self-pay

## 2022-11-16 MED ORDER — AMPHETAMINE-DEXTROAMPHETAMINE 10 MG PO TABS
5.0000 mg | ORAL_TABLET | Freq: Every day | ORAL | 0 refills | Status: DC
  Filled 2022-11-16: qty 30, 30d supply, fill #0

## 2022-11-16 MED ORDER — AMPHETAMINE-DEXTROAMPHET ER 30 MG PO CP24
30.0000 mg | ORAL_CAPSULE | Freq: Every morning | ORAL | 0 refills | Status: DC
  Filled 2022-11-16 (×2): qty 30, 30d supply, fill #0

## 2022-12-24 ENCOUNTER — Other Ambulatory Visit: Payer: Self-pay

## 2022-12-24 ENCOUNTER — Other Ambulatory Visit (HOSPITAL_BASED_OUTPATIENT_CLINIC_OR_DEPARTMENT_OTHER): Payer: Self-pay

## 2022-12-24 ENCOUNTER — Other Ambulatory Visit (HOSPITAL_COMMUNITY): Payer: Self-pay

## 2022-12-24 MED ORDER — AMPHETAMINE-DEXTROAMPHET ER 30 MG PO CP24
30.0000 mg | ORAL_CAPSULE | Freq: Every morning | ORAL | 0 refills | Status: DC
  Filled 2022-12-24: qty 30, 30d supply, fill #0

## 2022-12-25 ENCOUNTER — Other Ambulatory Visit (HOSPITAL_BASED_OUTPATIENT_CLINIC_OR_DEPARTMENT_OTHER): Payer: Self-pay

## 2022-12-25 ENCOUNTER — Other Ambulatory Visit (HOSPITAL_COMMUNITY): Payer: Self-pay

## 2022-12-25 MED ORDER — AMPHETAMINE-DEXTROAMPHET ER 30 MG PO CP24
30.0000 mg | ORAL_CAPSULE | Freq: Every morning | ORAL | 0 refills | Status: DC
  Filled 2022-12-25: qty 30, 30d supply, fill #0

## 2022-12-25 MED ORDER — AMPHETAMINE-DEXTROAMPHETAMINE 10 MG PO TABS
5.0000 mg | ORAL_TABLET | Freq: Every day | ORAL | 0 refills | Status: DC
  Filled 2022-12-25: qty 30, 30d supply, fill #0

## 2022-12-25 MED ORDER — CITALOPRAM HYDROBROMIDE 20 MG PO TABS
20.0000 mg | ORAL_TABLET | Freq: Every day | ORAL | 2 refills | Status: AC
  Filled 2022-12-25 – 2023-03-01 (×2): qty 90, 90d supply, fill #0
  Filled 2023-04-02: qty 90, 90d supply, fill #1

## 2023-01-11 ENCOUNTER — Other Ambulatory Visit (HOSPITAL_BASED_OUTPATIENT_CLINIC_OR_DEPARTMENT_OTHER): Payer: Self-pay

## 2023-01-11 MED ORDER — LORAZEPAM 0.5 MG PO TABS
0.5000 mg | ORAL_TABLET | Freq: Every day | ORAL | 0 refills | Status: AC
  Filled 2023-01-11 – 2023-01-21 (×2): qty 30, 30d supply, fill #0

## 2023-01-18 ENCOUNTER — Other Ambulatory Visit (HOSPITAL_BASED_OUTPATIENT_CLINIC_OR_DEPARTMENT_OTHER): Payer: Self-pay

## 2023-01-21 ENCOUNTER — Other Ambulatory Visit (HOSPITAL_BASED_OUTPATIENT_CLINIC_OR_DEPARTMENT_OTHER): Payer: Self-pay

## 2023-01-21 MED ORDER — AMPHETAMINE-DEXTROAMPHETAMINE 10 MG PO TABS
5.0000 mg | ORAL_TABLET | Freq: Every day | ORAL | 0 refills | Status: DC | PRN
  Filled 2023-01-21 – 2023-01-22 (×3): qty 30, 30d supply, fill #0

## 2023-01-21 MED ORDER — AMPHETAMINE-DEXTROAMPHET ER 30 MG PO CP24
30.0000 mg | ORAL_CAPSULE | Freq: Every morning | ORAL | 0 refills | Status: DC
  Filled 2023-01-21 – 2023-01-22 (×3): qty 30, 30d supply, fill #0

## 2023-01-22 ENCOUNTER — Other Ambulatory Visit (HOSPITAL_BASED_OUTPATIENT_CLINIC_OR_DEPARTMENT_OTHER): Payer: Self-pay

## 2023-01-22 ENCOUNTER — Other Ambulatory Visit: Payer: Self-pay

## 2023-03-01 ENCOUNTER — Other Ambulatory Visit (HOSPITAL_BASED_OUTPATIENT_CLINIC_OR_DEPARTMENT_OTHER): Payer: Self-pay

## 2023-03-01 MED ORDER — AMPHETAMINE-DEXTROAMPHET ER 30 MG PO CP24
30.0000 mg | ORAL_CAPSULE | Freq: Every morning | ORAL | 0 refills | Status: DC
  Filled 2023-03-01: qty 30, 30d supply, fill #0

## 2023-03-01 MED ORDER — AMPHETAMINE-DEXTROAMPHETAMINE 10 MG PO TABS
5.0000 mg | ORAL_TABLET | Freq: Every day | ORAL | 0 refills | Status: DC
  Filled 2023-03-01: qty 30, 30d supply, fill #0

## 2023-04-01 ENCOUNTER — Other Ambulatory Visit (HOSPITAL_BASED_OUTPATIENT_CLINIC_OR_DEPARTMENT_OTHER): Payer: Self-pay

## 2023-04-01 MED ORDER — AMPHETAMINE-DEXTROAMPHETAMINE 10 MG PO TABS
5.0000 mg | ORAL_TABLET | Freq: Every day | ORAL | 0 refills | Status: AC
  Filled 2023-04-01: qty 30, 30d supply, fill #0

## 2023-04-01 MED ORDER — AMPHETAMINE-DEXTROAMPHET ER 30 MG PO CP24
30.0000 mg | ORAL_CAPSULE | Freq: Every morning | ORAL | 0 refills | Status: DC
  Filled 2023-04-01: qty 30, 30d supply, fill #0

## 2023-04-02 ENCOUNTER — Other Ambulatory Visit (HOSPITAL_BASED_OUTPATIENT_CLINIC_OR_DEPARTMENT_OTHER): Payer: Self-pay

## 2023-04-03 ENCOUNTER — Other Ambulatory Visit (HOSPITAL_BASED_OUTPATIENT_CLINIC_OR_DEPARTMENT_OTHER): Payer: Self-pay

## 2023-05-03 ENCOUNTER — Other Ambulatory Visit (HOSPITAL_BASED_OUTPATIENT_CLINIC_OR_DEPARTMENT_OTHER): Payer: Self-pay

## 2023-05-03 MED ORDER — AMPHETAMINE-DEXTROAMPHET ER 30 MG PO CP24
30.0000 mg | ORAL_CAPSULE | Freq: Every morning | ORAL | 0 refills | Status: AC
  Filled 2023-05-03 – 2023-06-16 (×3): qty 30, 30d supply, fill #0

## 2023-05-03 MED ORDER — AMPHETAMINE-DEXTROAMPHETAMINE 10 MG PO TABS
5.0000 mg | ORAL_TABLET | Freq: Every evening | ORAL | 0 refills | Status: AC
  Filled 2023-05-03 – 2023-05-04 (×2): qty 30, 30d supply, fill #0

## 2023-05-04 ENCOUNTER — Other Ambulatory Visit (HOSPITAL_BASED_OUTPATIENT_CLINIC_OR_DEPARTMENT_OTHER): Payer: Self-pay

## 2023-05-17 ENCOUNTER — Other Ambulatory Visit (HOSPITAL_BASED_OUTPATIENT_CLINIC_OR_DEPARTMENT_OTHER): Payer: Self-pay

## 2023-05-17 MED ORDER — AMPHETAMINE-DEXTROAMPHETAMINE 20 MG PO TABS
20.0000 mg | ORAL_TABLET | Freq: Two times a day (BID) | ORAL | 0 refills | Status: DC
  Filled 2023-05-17: qty 60, 30d supply, fill #0

## 2023-06-16 ENCOUNTER — Other Ambulatory Visit (HOSPITAL_BASED_OUTPATIENT_CLINIC_OR_DEPARTMENT_OTHER): Payer: Self-pay

## 2023-06-17 ENCOUNTER — Other Ambulatory Visit (HOSPITAL_BASED_OUTPATIENT_CLINIC_OR_DEPARTMENT_OTHER): Payer: Self-pay

## 2023-06-17 ENCOUNTER — Other Ambulatory Visit: Payer: Self-pay

## 2023-06-17 MED ORDER — AMPHETAMINE-DEXTROAMPHETAMINE 20 MG PO TABS
20.0000 mg | ORAL_TABLET | Freq: Two times a day (BID) | ORAL | 0 refills | Status: DC
  Filled 2023-06-17: qty 60, 30d supply, fill #0

## 2023-07-23 ENCOUNTER — Other Ambulatory Visit: Payer: Self-pay

## 2023-07-23 ENCOUNTER — Other Ambulatory Visit (HOSPITAL_BASED_OUTPATIENT_CLINIC_OR_DEPARTMENT_OTHER): Payer: Self-pay

## 2023-07-23 MED ORDER — AMPHETAMINE-DEXTROAMPHETAMINE 20 MG PO TABS
20.0000 mg | ORAL_TABLET | Freq: Two times a day (BID) | ORAL | 0 refills | Status: DC
Start: 2023-07-23 — End: 2023-08-23
  Filled 2023-07-23 (×2): qty 60, 30d supply, fill #0

## 2023-08-03 ENCOUNTER — Other Ambulatory Visit (HOSPITAL_BASED_OUTPATIENT_CLINIC_OR_DEPARTMENT_OTHER): Payer: Self-pay

## 2023-08-03 MED ORDER — MELOXICAM 15 MG PO TABS
15.0000 mg | ORAL_TABLET | Freq: Every day | ORAL | 2 refills | Status: DC
  Filled 2023-08-03: qty 30, 30d supply, fill #0

## 2023-08-03 MED ORDER — PREDNISONE 20 MG PO TABS
ORAL_TABLET | ORAL | 0 refills | Status: AC
  Filled 2023-08-03: qty 10, 7d supply, fill #0

## 2023-08-03 MED ORDER — CYCLOBENZAPRINE HCL 5 MG PO TABS
5.0000 mg | ORAL_TABLET | Freq: Every day | ORAL | 0 refills | Status: AC
  Filled 2023-08-03: qty 30, 30d supply, fill #0

## 2023-08-03 MED ORDER — LORAZEPAM 0.5 MG PO TABS
0.5000 mg | ORAL_TABLET | Freq: Every day | ORAL | 0 refills | Status: AC
  Filled 2023-08-03: qty 30, 30d supply, fill #0

## 2023-08-07 ENCOUNTER — Other Ambulatory Visit (HOSPITAL_BASED_OUTPATIENT_CLINIC_OR_DEPARTMENT_OTHER): Payer: Self-pay

## 2023-08-23 ENCOUNTER — Other Ambulatory Visit (HOSPITAL_BASED_OUTPATIENT_CLINIC_OR_DEPARTMENT_OTHER): Payer: Self-pay

## 2023-08-23 MED ORDER — AMPHETAMINE-DEXTROAMPHETAMINE 20 MG PO TABS
20.0000 mg | ORAL_TABLET | Freq: Two times a day (BID) | ORAL | 0 refills | Status: AC
  Filled 2023-08-23 – 2023-09-04 (×2): qty 60, 30d supply, fill #0

## 2023-08-23 MED ORDER — CITALOPRAM HYDROBROMIDE 20 MG PO TABS
20.0000 mg | ORAL_TABLET | Freq: Every day | ORAL | 2 refills | Status: AC
  Filled 2023-08-23 – 2023-09-04 (×2): qty 90, 90d supply, fill #0

## 2023-08-31 ENCOUNTER — Other Ambulatory Visit (HOSPITAL_BASED_OUTPATIENT_CLINIC_OR_DEPARTMENT_OTHER): Payer: Self-pay

## 2023-09-04 ENCOUNTER — Other Ambulatory Visit (HOSPITAL_BASED_OUTPATIENT_CLINIC_OR_DEPARTMENT_OTHER): Payer: Self-pay

## 2023-09-17 ENCOUNTER — Other Ambulatory Visit (HOSPITAL_BASED_OUTPATIENT_CLINIC_OR_DEPARTMENT_OTHER): Payer: Self-pay

## 2023-10-05 ENCOUNTER — Ambulatory Visit (HOSPITAL_BASED_OUTPATIENT_CLINIC_OR_DEPARTMENT_OTHER): Payer: Medicaid Other | Admitting: Physical Therapy

## 2023-12-20 ENCOUNTER — Other Ambulatory Visit (HOSPITAL_COMMUNITY): Payer: Self-pay

## 2023-12-23 ENCOUNTER — Other Ambulatory Visit (HOSPITAL_BASED_OUTPATIENT_CLINIC_OR_DEPARTMENT_OTHER): Payer: Self-pay

## 2023-12-23 MED ORDER — AMPHETAMINE-DEXTROAMPHETAMINE 20 MG PO TABS
20.0000 mg | ORAL_TABLET | Freq: Two times a day (BID) | ORAL | 0 refills | Status: AC
  Filled 2023-12-23: qty 60, 30d supply, fill #0

## 2024-03-21 ENCOUNTER — Other Ambulatory Visit (HOSPITAL_BASED_OUTPATIENT_CLINIC_OR_DEPARTMENT_OTHER): Payer: Self-pay

## 2024-03-21 MED ORDER — CITALOPRAM HYDROBROMIDE 20 MG PO TABS
20.0000 mg | ORAL_TABLET | Freq: Every day | ORAL | 2 refills | Status: AC
  Filled 2024-03-21: qty 90, 90d supply, fill #0

## 2024-03-21 MED ORDER — AMPHETAMINE-DEXTROAMPHET ER 20 MG PO CP24
20.0000 mg | ORAL_CAPSULE | Freq: Every morning | ORAL | 0 refills | Status: DC
Start: 2024-03-21 — End: 2024-04-21
  Filled 2024-03-21: qty 30, 30d supply, fill #0

## 2024-03-21 MED ORDER — LORAZEPAM 0.5 MG PO TABS
0.5000 mg | ORAL_TABLET | Freq: Every day | ORAL | 0 refills | Status: AC | PRN
Start: 2024-03-21 — End: ?
  Filled 2024-03-21: qty 30, 30d supply, fill #0

## 2024-03-22 ENCOUNTER — Other Ambulatory Visit (HOSPITAL_BASED_OUTPATIENT_CLINIC_OR_DEPARTMENT_OTHER): Payer: Self-pay

## 2024-04-21 ENCOUNTER — Other Ambulatory Visit (HOSPITAL_COMMUNITY): Payer: Self-pay

## 2024-04-21 ENCOUNTER — Other Ambulatory Visit (HOSPITAL_BASED_OUTPATIENT_CLINIC_OR_DEPARTMENT_OTHER): Payer: Self-pay

## 2024-04-21 MED ORDER — AMPHETAMINE-DEXTROAMPHET ER 20 MG PO CP24
20.0000 mg | ORAL_CAPSULE | Freq: Every morning | ORAL | 0 refills | Status: DC
  Filled 2024-04-21: qty 30, 30d supply, fill #0

## 2024-04-24 ENCOUNTER — Other Ambulatory Visit (HOSPITAL_BASED_OUTPATIENT_CLINIC_OR_DEPARTMENT_OTHER): Payer: Self-pay

## 2024-04-24 ENCOUNTER — Other Ambulatory Visit (HOSPITAL_COMMUNITY): Payer: Self-pay

## 2024-04-24 MED ORDER — AMPHETAMINE-DEXTROAMPHETAMINE 10 MG PO TABS
10.0000 mg | ORAL_TABLET | Freq: Every day | ORAL | 0 refills | Status: AC | PRN
  Filled 2024-04-24: qty 30, 30d supply, fill #0

## 2024-05-30 ENCOUNTER — Other Ambulatory Visit (HOSPITAL_BASED_OUTPATIENT_CLINIC_OR_DEPARTMENT_OTHER): Payer: Self-pay

## 2024-05-30 MED ORDER — AMPHETAMINE-DEXTROAMPHETAMINE 10 MG PO TABS
10.0000 mg | ORAL_TABLET | Freq: Every day | ORAL | 0 refills | Status: AC
  Filled 2024-05-30: qty 30, 30d supply, fill #0

## 2024-06-01 ENCOUNTER — Other Ambulatory Visit (HOSPITAL_BASED_OUTPATIENT_CLINIC_OR_DEPARTMENT_OTHER): Payer: Self-pay

## 2024-06-02 ENCOUNTER — Other Ambulatory Visit (HOSPITAL_BASED_OUTPATIENT_CLINIC_OR_DEPARTMENT_OTHER): Payer: Self-pay

## 2024-06-02 MED ORDER — AMPHETAMINE-DEXTROAMPHET ER 20 MG PO CP24
20.0000 mg | ORAL_CAPSULE | Freq: Every morning | ORAL | 0 refills | Status: DC
  Filled 2024-06-02: qty 30, 30d supply, fill #0

## 2024-06-03 ENCOUNTER — Other Ambulatory Visit (HOSPITAL_BASED_OUTPATIENT_CLINIC_OR_DEPARTMENT_OTHER): Payer: Self-pay

## 2024-07-06 ENCOUNTER — Other Ambulatory Visit (HOSPITAL_BASED_OUTPATIENT_CLINIC_OR_DEPARTMENT_OTHER): Payer: Self-pay

## 2024-07-06 MED ORDER — CYCLOBENZAPRINE HCL 5 MG PO TABS
5.0000 mg | ORAL_TABLET | Freq: Every evening | ORAL | 0 refills | Status: AC | PRN
  Filled 2024-07-06: qty 30, 30d supply, fill #0

## 2024-07-07 ENCOUNTER — Other Ambulatory Visit (HOSPITAL_BASED_OUTPATIENT_CLINIC_OR_DEPARTMENT_OTHER): Payer: Self-pay

## 2024-07-07 MED ORDER — LORAZEPAM 0.5 MG PO TABS
0.5000 mg | ORAL_TABLET | Freq: Every day | ORAL | 0 refills | Status: AC | PRN
  Filled 2024-07-07: qty 30, 30d supply, fill #0

## 2024-07-07 MED ORDER — AMPHETAMINE-DEXTROAMPHETAMINE 10 MG PO TABS
10.0000 mg | ORAL_TABLET | Freq: Every day | ORAL | 0 refills | Status: DC | PRN
  Filled 2024-07-07: qty 30, 30d supply, fill #0

## 2024-07-07 MED ORDER — AMPHETAMINE-DEXTROAMPHET ER 20 MG PO CP24
20.0000 mg | ORAL_CAPSULE | Freq: Every morning | ORAL | 0 refills | Status: DC
  Filled 2024-07-07: qty 30, 30d supply, fill #0

## 2024-08-11 ENCOUNTER — Other Ambulatory Visit (HOSPITAL_BASED_OUTPATIENT_CLINIC_OR_DEPARTMENT_OTHER): Payer: Self-pay

## 2024-08-11 MED ORDER — AMPHETAMINE-DEXTROAMPHETAMINE 10 MG PO TABS
10.0000 mg | ORAL_TABLET | Freq: Every day | ORAL | 0 refills | Status: AC | PRN
  Filled 2024-08-11: qty 30, 30d supply, fill #0

## 2024-08-11 MED ORDER — AMPHETAMINE-DEXTROAMPHET ER 20 MG PO CP24
20.0000 mg | ORAL_CAPSULE | Freq: Every morning | ORAL | 0 refills | Status: AC
  Filled 2024-08-11: qty 30, 30d supply, fill #0

## 2024-08-14 ENCOUNTER — Other Ambulatory Visit (HOSPITAL_BASED_OUTPATIENT_CLINIC_OR_DEPARTMENT_OTHER): Payer: Self-pay

## 2024-08-16 ENCOUNTER — Other Ambulatory Visit (HOSPITAL_BASED_OUTPATIENT_CLINIC_OR_DEPARTMENT_OTHER): Payer: Self-pay

## 2024-09-05 ENCOUNTER — Other Ambulatory Visit (HOSPITAL_BASED_OUTPATIENT_CLINIC_OR_DEPARTMENT_OTHER): Payer: Self-pay

## 2024-09-05 MED ORDER — AMPHETAMINE-DEXTROAMPHETAMINE 10 MG PO TABS
10.0000 mg | ORAL_TABLET | Freq: Every day | ORAL | 0 refills | Status: AC | PRN
  Filled 2024-09-13: qty 30, 30d supply, fill #0

## 2024-09-05 MED ORDER — AMPHETAMINE-DEXTROAMPHET ER 20 MG PO CP24
20.0000 mg | ORAL_CAPSULE | Freq: Every morning | ORAL | 0 refills | Status: AC
Start: 2024-09-09 — End: ?
  Filled 2024-09-13: qty 30, 30d supply, fill #0

## 2024-09-05 MED ORDER — LORAZEPAM 0.5 MG PO TABS
0.5000 mg | ORAL_TABLET | Freq: Every day | ORAL | 0 refills | Status: AC
  Filled 2024-09-05 – 2024-11-16 (×2): qty 30, 30d supply, fill #0

## 2024-09-05 MED ORDER — CITALOPRAM HYDROBROMIDE 20 MG PO TABS
20.0000 mg | ORAL_TABLET | Freq: Every day | ORAL | 1 refills | Status: AC
  Filled 2024-09-05 – 2024-10-16 (×2): qty 90, 90d supply, fill #0

## 2024-09-12 ENCOUNTER — Other Ambulatory Visit (HOSPITAL_BASED_OUTPATIENT_CLINIC_OR_DEPARTMENT_OTHER): Payer: Self-pay

## 2024-09-13 ENCOUNTER — Other Ambulatory Visit (HOSPITAL_BASED_OUTPATIENT_CLINIC_OR_DEPARTMENT_OTHER): Payer: Self-pay

## 2024-09-13 ENCOUNTER — Other Ambulatory Visit: Payer: Self-pay

## 2024-09-16 ENCOUNTER — Other Ambulatory Visit (HOSPITAL_BASED_OUTPATIENT_CLINIC_OR_DEPARTMENT_OTHER): Payer: Self-pay

## 2024-09-18 ENCOUNTER — Other Ambulatory Visit (HOSPITAL_BASED_OUTPATIENT_CLINIC_OR_DEPARTMENT_OTHER): Payer: Self-pay

## 2024-09-26 ENCOUNTER — Other Ambulatory Visit (HOSPITAL_BASED_OUTPATIENT_CLINIC_OR_DEPARTMENT_OTHER): Payer: Self-pay

## 2024-09-26 MED ORDER — AMPHETAMINE-DEXTROAMPHET ER 20 MG PO CP24
20.0000 mg | ORAL_CAPSULE | Freq: Every morning | ORAL | 0 refills | Status: AC
  Filled 2024-11-16: qty 30, 30d supply, fill #0

## 2024-09-26 MED ORDER — AMPHETAMINE-DEXTROAMPHET ER 20 MG PO CP24
20.0000 mg | ORAL_CAPSULE | Freq: Every morning | ORAL | 0 refills | Status: AC
  Filled 2024-10-16: qty 30, 30d supply, fill #0

## 2024-09-26 MED ORDER — AMPHETAMINE-DEXTROAMPHETAMINE 10 MG PO TABS
10.0000 mg | ORAL_TABLET | Freq: Every day | ORAL | 0 refills | Status: AC
  Filled 2024-11-16: qty 30, 30d supply, fill #0

## 2024-09-26 MED ORDER — AMPHETAMINE-DEXTROAMPHETAMINE 10 MG PO TABS
10.0000 mg | ORAL_TABLET | Freq: Every day | ORAL | 0 refills | Status: AC
  Filled 2024-10-16: qty 30, 30d supply, fill #0

## 2024-10-16 ENCOUNTER — Other Ambulatory Visit (HOSPITAL_BASED_OUTPATIENT_CLINIC_OR_DEPARTMENT_OTHER): Payer: Self-pay

## 2024-11-16 ENCOUNTER — Other Ambulatory Visit (HOSPITAL_BASED_OUTPATIENT_CLINIC_OR_DEPARTMENT_OTHER): Payer: Self-pay
